# Patient Record
Sex: Female | Born: 1990 | Race: White | Hispanic: No | Marital: Married | Smoking: Never smoker
Health system: Southern US, Community
[De-identification: ages and names within clinical notes are randomized; demographics above are authoritative.]

## PROBLEM LIST (undated history)

## (undated) DIAGNOSIS — F32A Depression, unspecified: Secondary | ICD-10-CM

## (undated) DIAGNOSIS — J45909 Unspecified asthma, uncomplicated: Secondary | ICD-10-CM

## (undated) DIAGNOSIS — K219 Gastro-esophageal reflux disease without esophagitis: Secondary | ICD-10-CM

## (undated) DIAGNOSIS — R51 Headache: Secondary | ICD-10-CM

## (undated) DIAGNOSIS — F419 Anxiety disorder, unspecified: Secondary | ICD-10-CM

## (undated) DIAGNOSIS — T7840XA Allergy, unspecified, initial encounter: Secondary | ICD-10-CM

## (undated) DIAGNOSIS — R519 Headache, unspecified: Secondary | ICD-10-CM

## (undated) DIAGNOSIS — R000 Tachycardia, unspecified: Secondary | ICD-10-CM

## (undated) DIAGNOSIS — R0600 Dyspnea, unspecified: Secondary | ICD-10-CM

## (undated) DIAGNOSIS — D649 Anemia, unspecified: Secondary | ICD-10-CM

## (undated) HISTORY — PX: WISDOM TOOTH EXTRACTION: SHX21

## (undated) HISTORY — DX: Allergy, unspecified, initial encounter: T78.40XA

## (undated) HISTORY — DX: Headache: R51

## (undated) HISTORY — DX: Anxiety disorder, unspecified: F41.9

## (undated) HISTORY — DX: Depression, unspecified: F32.A

## (undated) HISTORY — DX: Anemia, unspecified: D64.9

## (undated) HISTORY — DX: Gastro-esophageal reflux disease without esophagitis: K21.9

## (undated) HISTORY — DX: Headache, unspecified: R51.9

---
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
## 2016-05-15 ENCOUNTER — Other Ambulatory Visit: Payer: Self-pay | Admitting: Family Medicine

## 2016-05-15 DIAGNOSIS — E010 Iodine-deficiency related diffuse (endemic) goiter: Secondary | ICD-10-CM

## 2016-05-19 ENCOUNTER — Ambulatory Visit
Admission: RE | Admit: 2016-05-19 | Discharge: 2016-05-19 | Disposition: A | Discharge status: Home / Self Care | Payer: BLUE CROSS/BLUE SHIELD | Source: Ambulatory Visit | Attending: Family Medicine | Admitting: Family Medicine

## 2016-05-19 DIAGNOSIS — E010 Iodine-deficiency related diffuse (endemic) goiter: Secondary | ICD-10-CM

## 2017-08-17 IMAGING — US US THYROID
1 series · 14 of 25 positions shown · non-contrast
Comparison: None.

CLINICAL DATA: Thyromegaly on physical exam. Family history of
Hashimoto's disease.

EXAM:
THYROID ULTRASOUND
TECHNIQUE: Ultrasound examination of the thyroid gland and adjacent soft
tissues was performed.

[Series 1: us thyroid · 0.05mm/px · 14 of 36 slices shown]
[im 1/36]
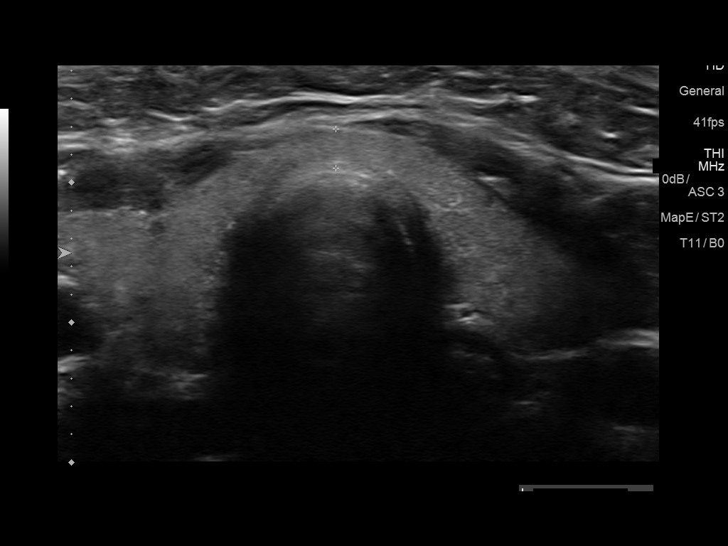
[im 3/36]
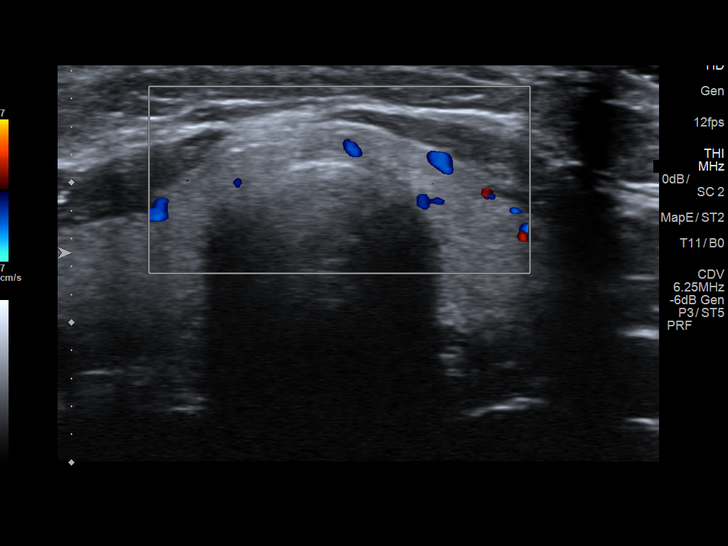
[im 6/36]
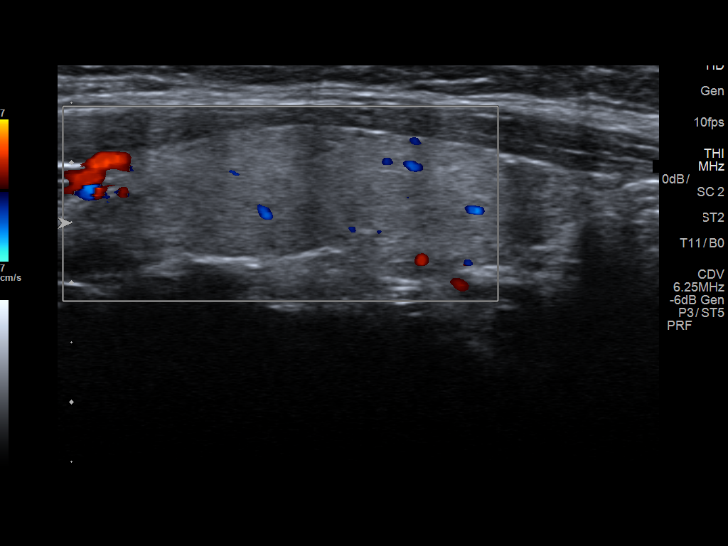
[im 9/36]
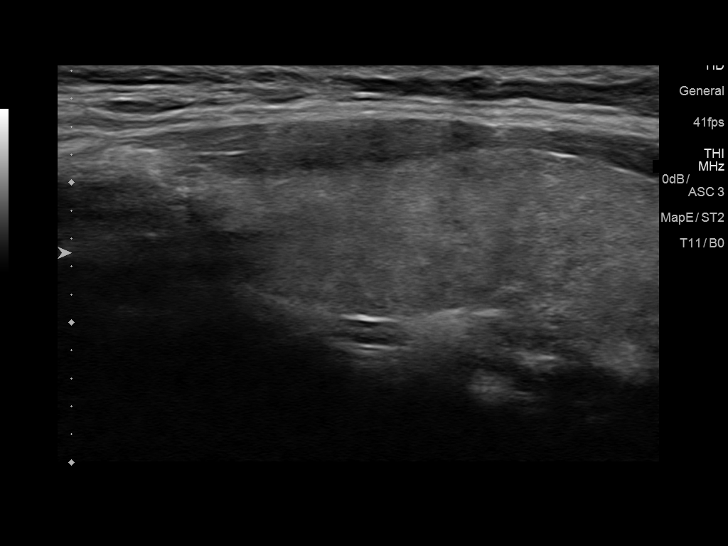
[im 12/36]
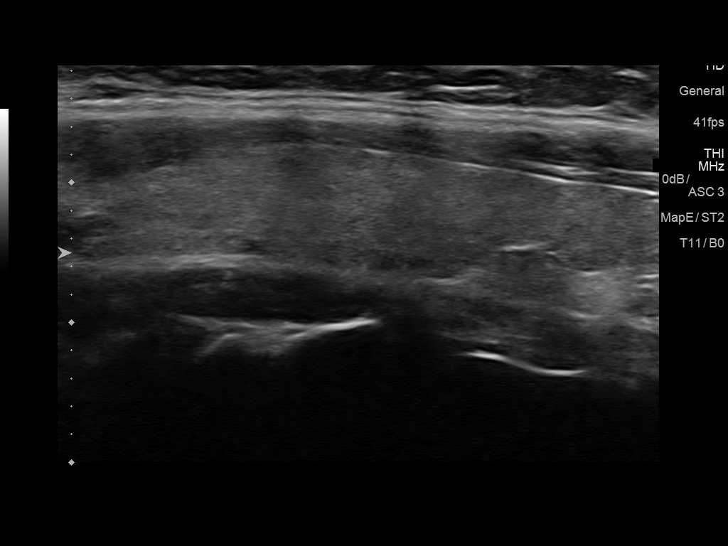
[im 14/36]
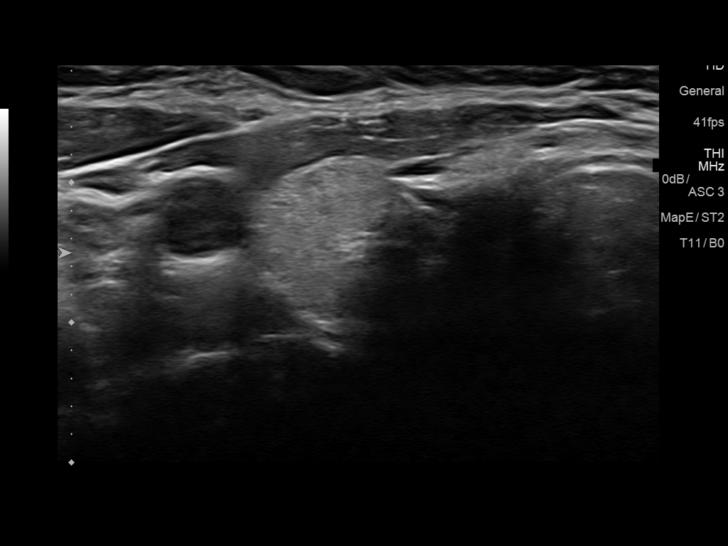
[im 17/36]
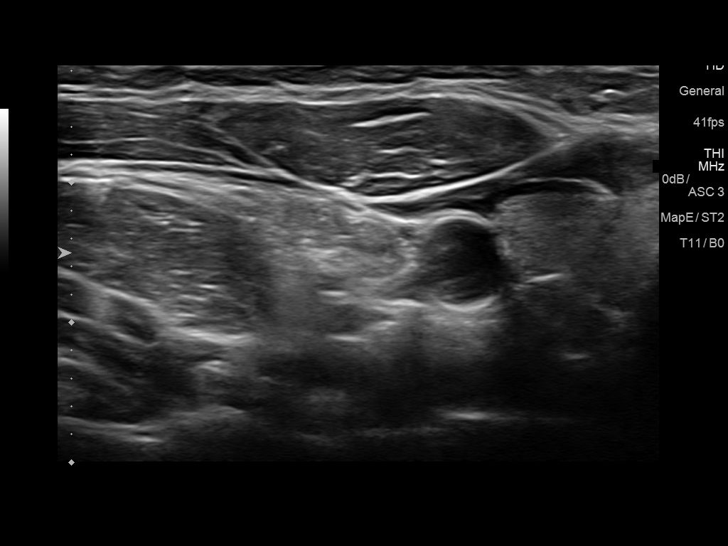
[im 19/36]
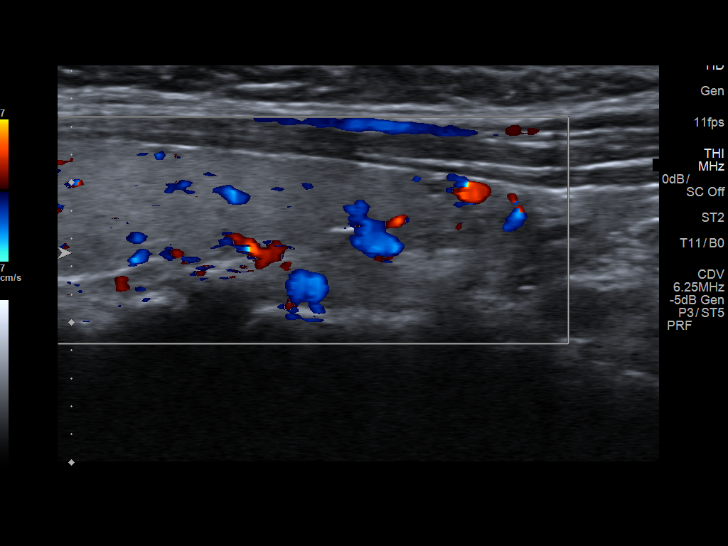
[im 22/36]
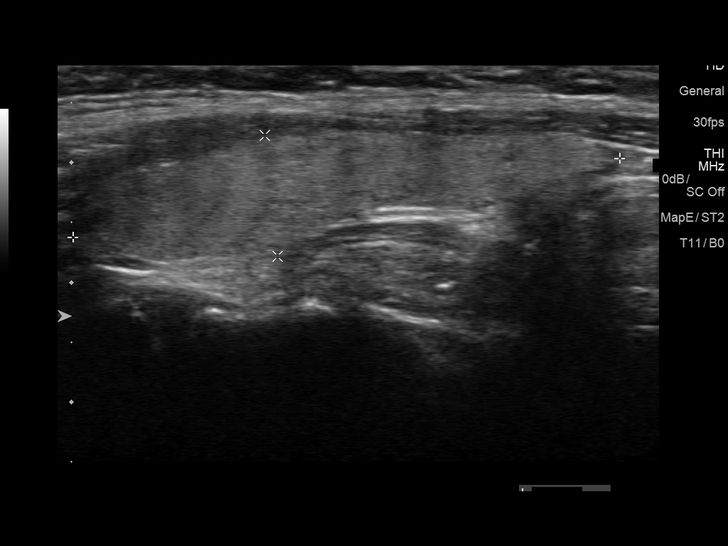
[im 24/36]
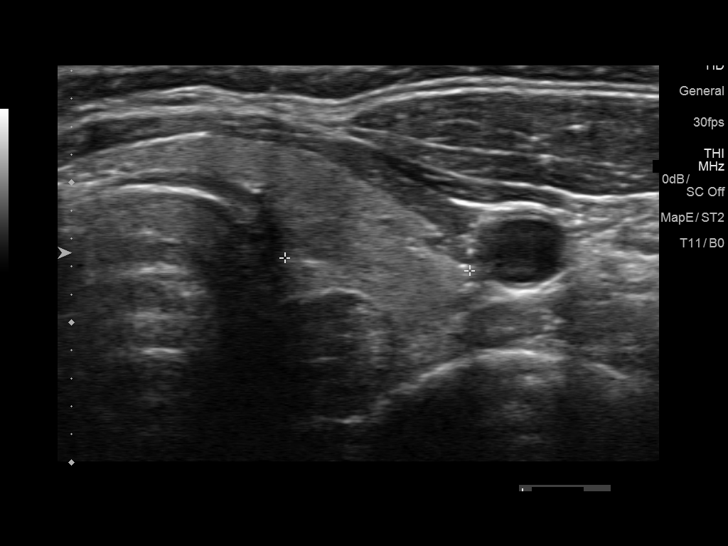
[im 27/36]
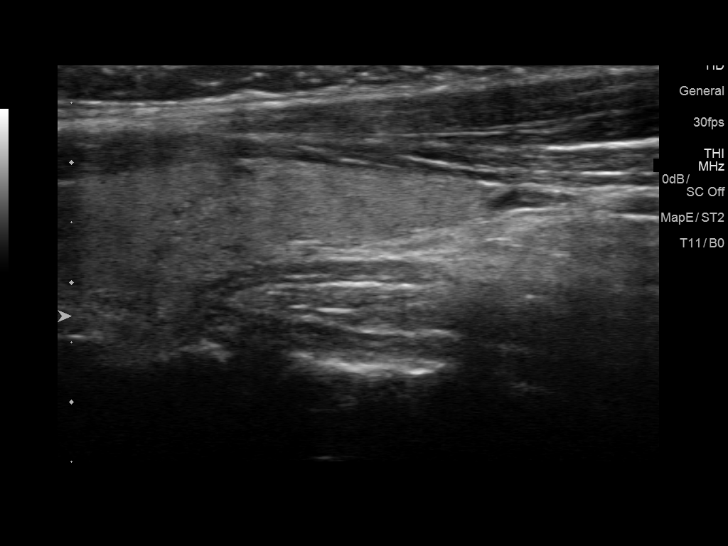
[im 30/36]
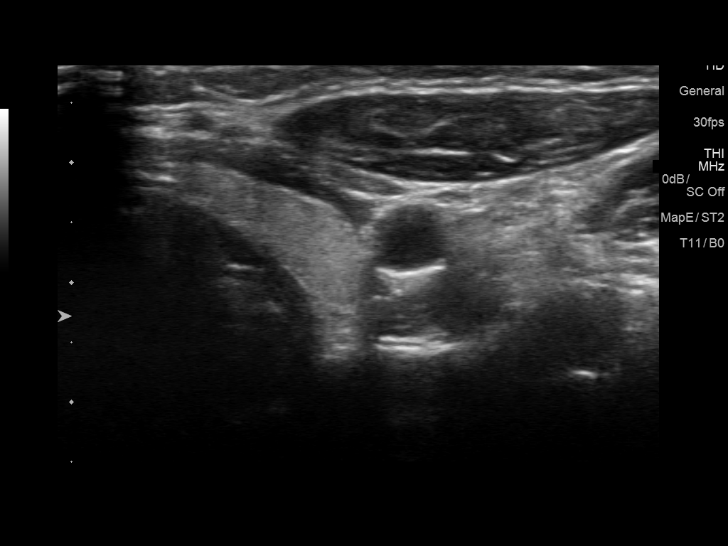
[im 33/36]
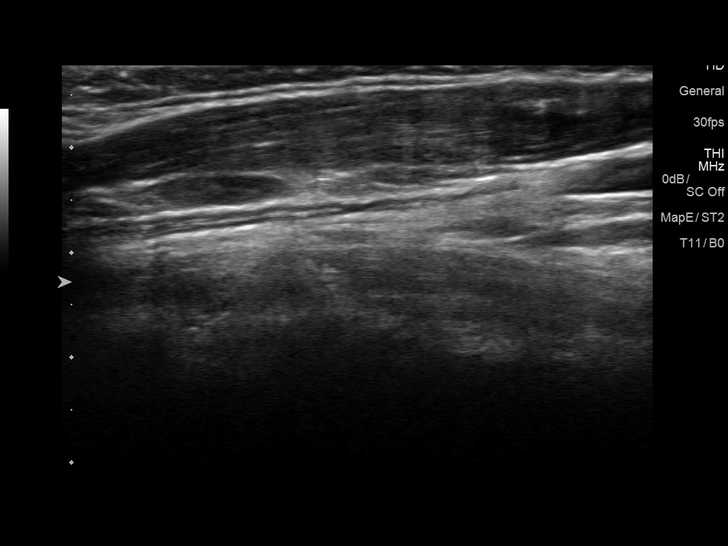
[im 36/36]
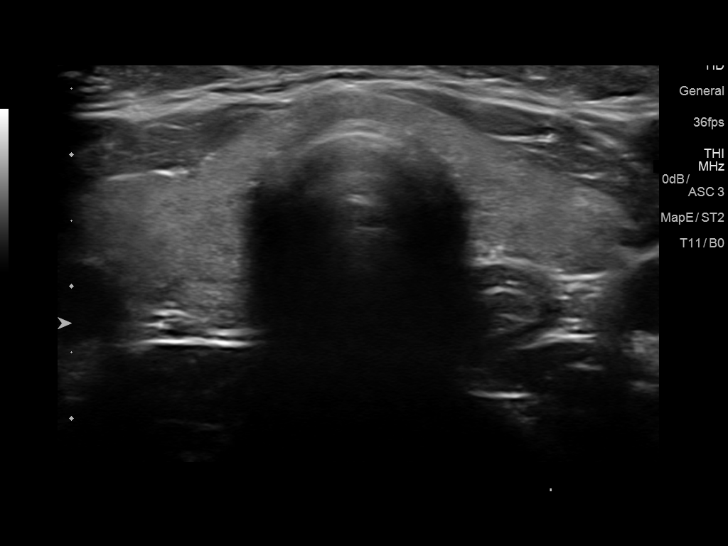

[14 of 25 positions shown; findings below may reference images not displayed]

FINDINGS: Parenchymal Echotexture: Mildly heterogenous

Isthmus: 0.3 cm thickness

Right lobe: 4.4 x 1.2 x 1.5 cm

Left lobe: 4.6 x 1 x 1.3 cm

_________________________________________________________

Estimated total number of nodules >/= 1 cm: 0

Number of spongiform nodules >/=  2 cm not described below (TR1): 0

Number of mixed cystic and solid nodules >/= 1.5 cm not described
below (TR2): 0

No discrete nodules are seen within the thyroid gland.
IMPRESSION: 1. Thyroid is upper limits normal in size. No nodule or focal
lesion.

The above is in keeping with the ACR TI-RADS recommendations - [HOSPITAL] 1085;[DATE].

## 2017-11-17 LAB — OB RESULTS CONSOLE ABO/RH: RH Type: POSITIVE

## 2017-11-17 LAB — OB RESULTS CONSOLE RUBELLA ANTIBODY, IGM: Rubella: IMMUNE

## 2017-11-17 LAB — OB RESULTS CONSOLE HIV ANTIBODY (ROUTINE TESTING): HIV: NONREACTIVE

## 2017-11-17 LAB — OB RESULTS CONSOLE GC/CHLAMYDIA
Chlamydia: NEGATIVE
Gonorrhea: NEGATIVE

## 2017-11-17 LAB — OB RESULTS CONSOLE ANTIBODY SCREEN: Antibody Screen: NEGATIVE

## 2017-11-17 LAB — OB RESULTS CONSOLE RPR: RPR: NONREACTIVE

## 2017-11-17 LAB — OB RESULTS CONSOLE HEPATITIS B SURFACE ANTIGEN: Hepatitis B Surface Ag: NEGATIVE

## 2018-04-30 LAB — OB RESULTS CONSOLE GBS: GBS: POSITIVE

## 2018-05-05 ENCOUNTER — Encounter (HOSPITAL_COMMUNITY): Payer: Self-pay | Admitting: *Deleted

## 2018-05-05 ENCOUNTER — Telehealth (HOSPITAL_COMMUNITY): Payer: Self-pay | Admitting: *Deleted

## 2018-05-05 NOTE — Telephone Encounter (Signed)
Preadmission screen  

## 2018-05-07 ENCOUNTER — Inpatient Hospital Stay (HOSPITAL_COMMUNITY): Admission: RE | Admit: 2018-05-07 | Payer: BLUE CROSS/BLUE SHIELD | Source: Ambulatory Visit

## 2018-05-17 ENCOUNTER — Other Ambulatory Visit: Payer: Self-pay | Admitting: Obstetrics

## 2018-05-20 ENCOUNTER — Encounter (HOSPITAL_COMMUNITY): Payer: Self-pay

## 2018-05-28 ENCOUNTER — Encounter (HOSPITAL_COMMUNITY)
Admission: RE | Admit: 2018-05-28 | Discharge: 2018-05-28 | Disposition: A | Discharge status: Home / Self Care | Payer: Managed Care, Other (non HMO) | Source: Ambulatory Visit

## 2018-05-28 NOTE — Patient Instructions (Signed)
Ranell PatrickKelsey M Mcnellis  05/28/2018   Your procedure is scheduled on:  05/31/2018  Enter through the Main Entrance of [facility] at 1200 PM.  Pick up the phone at the desk and dial [phone]  Call this number if you have problems the morning of [phone]  Remember:   Do not eat food:(After Midnight) Desps de medianoche.  Do not drink clear liquids: (After Midnight) Desps de medianoche.  Take these medicines the morning of surgery with A SIP OF WATER: none   Do not wear jewelry, make-up or nail polish.  Do not wear lotions, powders, or perfumes. Do not wear deodorant.  Do not shave 48 hours prior to surgery.  Do not bring valuables to the hospital.  [facility] is not   responsible for any belongings or valuables brought to the hospital.  Contacts, dentures or bridgework may not be worn into surgery.  Leave suitcase in the car. After surgery it may be brought to your room.  For patients admitted to the hospital, checkout time is 11:00 AM the day of              discharge.    N/A   Please read over the following fact sheets that you were given:   Surgical Site Infection Prevention

## 2018-05-28 NOTE — Pre-Procedure Instructions (Signed)
Pt has not arrived for her preop visit.  Contacted by phone.  Pt states she is sick and is at her doctor's office for a flu check.  She says she will contact her OB with the results and determine if her CS needs to be delayed.  She states she will contact me after talking to Dr Elpidio Eric office.

## 2018-05-31 SURGERY — Surgical Case
Anesthesia: Spinal

## 2018-06-04 ENCOUNTER — Inpatient Hospital Stay (HOSPITAL_COMMUNITY): Payer: Managed Care, Other (non HMO) | Admitting: Certified Registered"

## 2018-06-04 ENCOUNTER — Encounter (HOSPITAL_COMMUNITY): Payer: Self-pay | Admitting: *Deleted

## 2018-06-04 ENCOUNTER — Encounter (HOSPITAL_COMMUNITY)
Admission: AD | Disposition: A | Discharge status: Home / Self Care | Payer: Self-pay | Source: Home / Self Care | Attending: Obstetrics and Gynecology

## 2018-06-04 ENCOUNTER — Encounter (HOSPITAL_COMMUNITY)
Admission: RE | Admit: 2018-06-04 | Discharge: 2018-06-04 | Disposition: A | Discharge status: Home / Self Care | Payer: Managed Care, Other (non HMO) | Source: Ambulatory Visit | Attending: Obstetrics | Admitting: Obstetrics

## 2018-06-04 ENCOUNTER — Inpatient Hospital Stay (HOSPITAL_COMMUNITY)
Admission: AD | Admit: 2018-06-04 | Discharge: 2018-06-07 | DRG: 787 | Disposition: A | Discharge status: Home / Self Care | Payer: Managed Care, Other (non HMO) | Attending: Obstetrics and Gynecology | Admitting: Obstetrics and Gynecology

## 2018-06-04 DIAGNOSIS — Z3A40 40 weeks gestation of pregnancy: Secondary | ICD-10-CM

## 2018-06-04 DIAGNOSIS — O321XX0 Maternal care for breech presentation, not applicable or unspecified: Secondary | ICD-10-CM | POA: Diagnosis present

## 2018-06-04 DIAGNOSIS — D62 Acute posthemorrhagic anemia: Secondary | ICD-10-CM | POA: Diagnosis not present

## 2018-06-04 DIAGNOSIS — O99824 Streptococcus B carrier state complicating childbirth: Secondary | ICD-10-CM | POA: Diagnosis present

## 2018-06-04 DIAGNOSIS — O9081 Anemia of the puerperium: Secondary | ICD-10-CM | POA: Diagnosis not present

## 2018-06-04 LAB — TYPE AND SCREEN
ABO/RH(D): O POS
Antibody Screen: NEGATIVE

## 2018-06-04 LAB — CBC
HCT: 40.3 % (ref 36.0–46.0)
Hemoglobin: 13 g/dL (ref 12.0–15.0)
MCH: 26.7 pg (ref 26.0–34.0)
MCHC: 32.3 g/dL (ref 30.0–36.0)
MCV: 82.8 fL (ref 80.0–100.0)
Platelets: 201 10*3/uL (ref 150–400)
RBC: 4.87 MIL/uL (ref 3.87–5.11)
RDW: 17.3 % — ABNORMAL HIGH (ref 11.5–15.5)
WBC: 10.5 10*3/uL (ref 4.0–10.5)
nRBC: 0 % (ref 0.0–0.2)

## 2018-06-04 LAB — ABO/RH: ABO/RH(D): O POS

## 2018-06-04 LAB — POCT FERN TEST

## 2018-06-04 SURGERY — Surgical Case
Anesthesia: Spinal | Site: Abdomen | Wound class: Clean Contaminated

## 2018-06-04 MED ORDER — LACTATED RINGERS IV SOLN
INTRAVENOUS | Status: DC
  Administered 2018-06-04 – 2018-06-05 (×3): via INTRAVENOUS

## 2018-06-04 MED ORDER — SOD CITRATE-CITRIC ACID 500-334 MG/5ML PO SOLN
ORAL | Status: AC
  Filled 2018-06-04: qty 15

## 2018-06-04 MED ORDER — FENTANYL CITRATE (PF) 100 MCG/2ML IJ SOLN
INTRAMUSCULAR | Status: AC
  Filled 2018-06-04: qty 2

## 2018-06-04 MED ORDER — CEFAZOLIN SODIUM-DEXTROSE 2-4 GM/100ML-% IV SOLN
2.0000 g | INTRAVENOUS | Status: AC
  Administered 2018-06-05: 2 g via INTRAVENOUS
  Filled 2018-06-04: qty 100

## 2018-06-04 MED ORDER — MORPHINE SULFATE (PF) 0.5 MG/ML IJ SOLN
INTRAMUSCULAR | Status: AC
  Filled 2018-06-04: qty 10

## 2018-06-04 SURGICAL SUPPLY — 39 items
BENZOIN TINCTURE PRP APPL 2/3 (GAUZE/BANDAGES/DRESSINGS) ×2 IMPLANT
CHLORAPREP W/TINT 26ML (MISCELLANEOUS) ×2 IMPLANT
CLAMP CORD UMBIL (MISCELLANEOUS) IMPLANT
CLOTH BEACON ORANGE TIMEOUT ST (SAFETY) ×2 IMPLANT
DRSG OPSITE POSTOP 4X10 (GAUZE/BANDAGES/DRESSINGS) ×2 IMPLANT
ELECT REM PT RETURN 9FT ADLT (ELECTROSURGICAL) ×2
ELECTRODE REM PT RTRN 9FT ADLT (ELECTROSURGICAL) ×1 IMPLANT
EXTRACTOR VACUUM M CUP 4 TUBE (SUCTIONS) IMPLANT
GLOVE BIO SURGEON STRL SZ 6.5 (GLOVE) ×14 IMPLANT
GLOVE BIOGEL PI IND STRL 7.0 (GLOVE) ×5 IMPLANT
GLOVE BIOGEL PI INDICATOR 7.0 (GLOVE) ×5
GOWN STRL REUS W/TWL LRG LVL3 (GOWN DISPOSABLE) ×6 IMPLANT
HEMOSTAT ARISTA ABSORB 3G PWDR (HEMOSTASIS) ×2 IMPLANT
KIT ABG SYR 3ML LUER SLIP (SYRINGE) IMPLANT
NEEDLE HYPO 22GX1.5 SAFETY (NEEDLE) IMPLANT
NEEDLE HYPO 25X5/8 SAFETYGLIDE (NEEDLE) IMPLANT
NS IRRIG 1000ML POUR BTL (IV SOLUTION) ×2 IMPLANT
PACK C SECTION WH (CUSTOM PROCEDURE TRAY) ×2 IMPLANT
PAD OB MATERNITY 4.3X12.25 (PERSONAL CARE ITEMS) ×2 IMPLANT
PENCIL SMOKE EVAC W/HOLSTER (ELECTROSURGICAL) ×2 IMPLANT
SPONGE LAP 18X18 RF (DISPOSABLE) ×6 IMPLANT
STRIP CLOSURE SKIN 1/2X4 (GAUZE/BANDAGES/DRESSINGS) ×2 IMPLANT
SUT MNCRL 0 VIOLET CTX 36 (SUTURE) ×2 IMPLANT
SUT MON AB 4-0 PS1 27 (SUTURE) ×2 IMPLANT
SUT MONOCRYL 0 CTX 36 (SUTURE) ×2
SUT PLAIN 0 NONE (SUTURE) IMPLANT
SUT PLAIN 2 0 XLH (SUTURE) IMPLANT
SUT VIC AB 0 CT1 36 (SUTURE) ×10 IMPLANT
SUT VIC AB 0 CTX 36 (SUTURE) ×2
SUT VIC AB 0 CTX36XBRD ANBCTRL (SUTURE) ×2 IMPLANT
SUT VIC AB 2-0 CT1 27 (SUTURE) ×1
SUT VIC AB 2-0 CT1 TAPERPNT 27 (SUTURE) ×1 IMPLANT
SUT VIC AB 3-0 CT1 27 (SUTURE) ×1
SUT VIC AB 3-0 CT1 TAPERPNT 27 (SUTURE) ×1 IMPLANT
SUT VIC AB 4-0 KS 27 (SUTURE) ×2 IMPLANT
SYR CONTROL 10ML LL (SYRINGE) IMPLANT
TOWEL OR 17X24 6PK STRL BLUE (TOWEL DISPOSABLE) ×2 IMPLANT
TRAY FOLEY W/BAG SLVR 14FR LF (SET/KITS/TRAYS/PACK) IMPLANT
WATER STERILE IRR 1000ML POUR (IV SOLUTION) ×2 IMPLANT

## 2018-06-04 NOTE — Anesthesia Preprocedure Evaluation (Signed)
Anesthesia Evaluation  Patient identified by MRN, date of birth, ID band Patient awake    Reviewed: Allergy & Precautions, NPO status , Patient's Chart, lab work & pertinent test results  Airway Mallampati: II  TM Distance: >3 FB Neck ROM: Full    Dental  (+) Dental Advisory Given   Pulmonary neg pulmonary ROS,    Pulmonary exam normal breath sounds clear to auscultation       Cardiovascular negative cardio ROS Normal cardiovascular exam Rhythm:Regular Rate:Normal     Neuro/Psych  Headaches, negative psych ROS   GI/Hepatic negative GI ROS, Neg liver ROS,   Endo/Other  negative endocrine ROS  Renal/GU negative Renal ROS     Musculoskeletal negative musculoskeletal ROS (+)   Abdominal   Peds  Hematology negative hematology ROS (+) anemia ,   Anesthesia Other Findings   Reproductive/Obstetrics (+) Pregnancy                             Anesthesia Physical Anesthesia Plan  ASA: II  Anesthesia Plan: Spinal   Post-op Pain Management:    Induction:   PONV Risk Score and Plan: Ondansetron, Treatment may vary due to age or medical condition and Scopolamine patch - Pre-op  Airway Management Planned:   Additional Equipment:   Intra-op Plan:   Post-operative Plan:   Informed Consent: I have reviewed the patients History and Physical, chart, labs and discussed the procedure including the risks, benefits and alternatives for the proposed anesthesia with the patient or authorized representative who has indicated his/her understanding and acceptance.     Dental advisory given  Plan Discussed with: CRNA  Anesthesia Plan Comments:         Anesthesia Quick Evaluation

## 2018-06-04 NOTE — MAU Note (Signed)
Ctxs for about 2hrs. Feeling a lot of pain lower back. Baby is breech -for prim c/s this Monday morning. Dx flu last Friday and completed Tamiflu. No fever since last Sunday. Some pink/brown d/c today. My underwear has been wet at times but nothing consistent.

## 2018-06-04 NOTE — Patient Instructions (Signed)
TENAJ HARNETT  06/04/2018   Your procedure is scheduled on:  06/07/2018  Enter through the Main Entrance of [facility] at 0745 AM.  Pick up the phone at the desk and dial [phone]  Call this number if you have problems the morning of surgery:[phone]  Remember:   Do not eat food:(After Midnight) Desps de medianoche.  Do not drink clear liquids: (After Midnight) Desps de medianoche.  Take these medicines the morning of surgery with A SIP OF WATER: bring your inhaler with you   Do not wear jewelry, make-up or nail polish.  Do not wear lotions, powders, or perfumes. Do not wear deodorant.  Do not shave 48 hours prior to surgery.  Do not bring valuables to the hospital.  [facility] is not   responsible for any belongings or valuables brought to the hospital.  Contacts, dentures or bridgework may not be worn into surgery.  Leave suitcase in the car. After surgery it may be brought to your room.  For patients admitted to the hospital, checkout time is 11:00 AM the day of              discharge.    N/A   Please read over the following fact sheets that you were given:   Surgical Site Infection Prevention

## 2018-06-04 NOTE — H&P (Signed)
Vickie Maldonado is a 28 y.o. G1P0 at [redacted]w[redacted]d gestation presents for complaint of Contractions, denies LOF, vaginal bleeding.  Breech presentation.  Antepartum course: complicated with Flu last week, s/p Tamiflu and no c/o  [facility] since 11 wks.  See complete pre-natal records  History OB History    Gravida  1   Para      Term      Preterm      AB      Living        SAB      TAB      Ectopic      Multiple      Live Births             Past Medical History:  Diagnosis Date  . Anemia   . Headache    Past Surgical History:  Procedure Laterality Date  . WISDOM TOOTH EXTRACTION     Family History: family history includes Breast cancer in her maternal aunt and maternal grandmother; Hypertension in her father, mother, and paternal grandfather. Social History:  reports that she has never smoked. She has never used smokeless tobacco. She reports previous alcohol use. She reports that she does not use drugs.  ROS: See above otherwise negative  Prenatal labs:  ABO, Rh: --/--/O POS Performed at [facility], [address] [place], [place] [address]  [phone]) Antibody: NEG (02/14 0915) Rubella: Immune (07/30 0000) RPR: Nonreactive (07/30 0000)  HBsAg: Negative (07/30 0000)  HIV:Non-reactive (07/30 0000)   GBS: Positive (01/10 0000)  1 hr Glucola: Normal Genetic screening: Normal Anatomy US: Normal  Physical Exam:   Dilation: 2.5 Effacement (%): 80 Station: -2 Exam by:: Lafonda Mosses Ansah-Mensah, rnc  Blood pressure 134/78, pulse 89, temperature 98.3 F (36.8 C), resp. rate 18, height 5\' 7"  (1.702 m), weight 93 kg. A&O x 3 HEENT: Normal Lungs: CTAB CV: RRR Abdominal: Soft, Non-tender, Gravid and Estimated fetal weight: 7.5 lbs lbs  Lower Extremities: Non-edematous, Non-tender  Pelvic Exam:   deferred  Labs:  CBC:  Lab Results  Component Value Date   WBC 10.5 06/04/2018   RBC 4.87 06/04/2018   HGB 13.0 06/04/2018   HCT 40.3  06/04/2018   MCV 82.8 06/04/2018   MCH 26.7 06/04/2018   MCHC 32.3 06/04/2018   RDW 17.3 (H) 06/04/2018   PLT 201 06/04/2018   CMP: No results found for: NA, K, CL, CO2, GLUCOSE, BUN, CREATININE, CALCIUM, PROT, AST, ALT, ALBUMIN, ALKPHOS, BILITOT, GFRNONAA, GFRAA, ANIONGAP Urine: No results found for: COLORURINE, APPEARANCEUR, LABSPEC, PHURINE, GLUCOSEU, HGBUR, BILIRUBINUR, KETONESUR, PROTEINUR, NITRITE, LEUKOCYTESUR   Prenatal Transfer Tool  Maternal Diabetes: No Genetic Screening: Normal Maternal Ultrasounds/Referrals: Normal Fetal Ultrasounds or other Referrals:  None Maternal Substance Abuse:  No Significant Maternal Medications:  None Significant Maternal Lab Results: None  U/s: breech, head  Assessment/Plan:  28 y.o. G1P0 at [redacted]w[redacted]d gestation   1. Active labor - admit for prenatal care, plan for c/s d/t malpresentation; reviewed surgery and risks, including but not limited to bleeding (possible need for transfution), infection, injury to bowel/bladder/nerves/blood vessels, risk of further surgery, risk of anesthesia, risk of blood clot to legs/lungs 2. Fetal status reassuring 3. GBS negative   Vickie Maldonado 06/04/2018, 11:44 PM

## 2018-06-05 ENCOUNTER — Encounter (HOSPITAL_COMMUNITY): Payer: Self-pay | Admitting: *Deleted

## 2018-06-05 LAB — CBC
HCT: 31.3 % — ABNORMAL LOW (ref 36.0–46.0)
Hemoglobin: 10 g/dL — ABNORMAL LOW (ref 12.0–15.0)
MCH: 26.1 pg (ref 26.0–34.0)
MCHC: 31.6 g/dL (ref 30.0–36.0)
MCV: 82.6 fL (ref 80.0–100.0)
Platelets: 193 10*3/uL (ref 150–400)
RBC: 3.79 MIL/uL — ABNORMAL LOW (ref 3.87–5.11)
RDW: 17.2 % — ABNORMAL HIGH (ref 11.5–15.5)
WBC: 20.9 10*3/uL — ABNORMAL HIGH (ref 4.0–10.5)
nRBC: 0 % (ref 0.0–0.2)

## 2018-06-05 LAB — RPR: RPR Ser Ql: NONREACTIVE

## 2018-06-05 MED ORDER — FENTANYL CITRATE (PF) 100 MCG/2ML IJ SOLN
INTRAMUSCULAR | Status: DC | PRN
  Administered 2018-06-04: 15 ug via INTRATHECAL
  Administered 2018-06-05: 50 ug via INTRAVENOUS
  Administered 2018-06-05: 35 ug via INTRAVENOUS

## 2018-06-05 MED ORDER — DIPHENHYDRAMINE HCL 25 MG PO CAPS
25.0000 mg | ORAL_CAPSULE | Freq: Four times a day (QID) | ORAL | Status: DC | PRN
  Filled 2018-06-05: qty 1

## 2018-06-05 MED ORDER — MENTHOL 3 MG MT LOZG
1.0000 | LOZENGE | OROMUCOSAL | Status: DC | PRN
  Filled 2018-06-05: qty 9

## 2018-06-05 MED ORDER — SCOPOLAMINE 1 MG/3DAYS TD PT72: 1.0000 | MEDICATED_PATCH | Freq: Once | TRANSDERMAL | Status: DC

## 2018-06-05 MED ORDER — SIMETHICONE 80 MG PO CHEW
80.0000 mg | CHEWABLE_TABLET | Freq: Three times a day (TID) | ORAL | Status: DC
  Administered 2018-06-05 – 2018-06-07 (×7): 80 mg via ORAL
  Filled 2018-06-05 (×13): qty 1

## 2018-06-05 MED ORDER — LACTATED RINGERS IV SOLN
INTRAVENOUS | Status: DC
  Administered 2018-06-05: 04:00:00 via INTRAVENOUS

## 2018-06-05 MED ORDER — MEPERIDINE HCL 25 MG/ML IJ SOLN
INTRAMUSCULAR | Status: DC | PRN
  Administered 2018-06-05: 12.5 mg via INTRAVENOUS

## 2018-06-05 MED ORDER — DEXAMETHASONE SODIUM PHOSPHATE 4 MG/ML IJ SOLN
INTRAMUSCULAR | Status: AC
  Filled 2018-06-05: qty 1

## 2018-06-05 MED ORDER — SIMETHICONE 80 MG PO CHEW
80.0000 mg | CHEWABLE_TABLET | ORAL | Status: DC
  Administered 2018-06-06 (×2): 80 mg via ORAL
  Filled 2018-06-05 (×4): qty 1

## 2018-06-05 MED ORDER — DIPHENHYDRAMINE HCL 25 MG PO CAPS
25.0000 mg | ORAL_CAPSULE | ORAL | Status: DC | PRN
  Filled 2018-06-05: qty 1

## 2018-06-05 MED ORDER — SODIUM CHLORIDE 0.9 % IR SOLN
Status: DC | PRN
  Administered 2018-06-05: 600 mL

## 2018-06-05 MED ORDER — DIBUCAINE 1 % RE OINT
1.0000 "application " | TOPICAL_OINTMENT | RECTAL | Status: DC | PRN
  Administered 2018-06-06: 1 via RECTAL
  Filled 2018-06-05 (×2): qty 28

## 2018-06-05 MED ORDER — TETANUS-DIPHTH-ACELL PERTUSSIS 5-2.5-18.5 LF-MCG/0.5 IM SUSP
0.5000 mL | Freq: Once | INTRAMUSCULAR | Status: DC
  Filled 2018-06-05: qty 0.5

## 2018-06-05 MED ORDER — OXYTOCIN 40 UNITS IN NORMAL SALINE INFUSION - SIMPLE MED: 2.5000 [IU]/h | INTRAVENOUS | Status: AC

## 2018-06-05 MED ORDER — ONDANSETRON HCL 4 MG/2ML IJ SOLN: 4.0000 mg | Freq: Three times a day (TID) | INTRAMUSCULAR | Status: DC | PRN

## 2018-06-05 MED ORDER — NALBUPHINE HCL 10 MG/ML IJ SOLN: 5.0000 mg | INTRAMUSCULAR | Status: DC | PRN

## 2018-06-05 MED ORDER — MEPERIDINE HCL 25 MG/ML IJ SOLN: 6.2500 mg | INTRAMUSCULAR | Status: DC | PRN

## 2018-06-05 MED ORDER — SODIUM CHLORIDE 0.9% FLUSH: 3.0000 mL | INTRAVENOUS | Status: DC | PRN

## 2018-06-05 MED ORDER — HYDROMORPHONE HCL 1 MG/ML IJ SOLN: 0.2500 mg | INTRAMUSCULAR | Status: DC | PRN

## 2018-06-05 MED ORDER — NALBUPHINE HCL 10 MG/ML IJ SOLN: 5.0000 mg | Freq: Once | INTRAMUSCULAR | Status: DC | PRN

## 2018-06-05 MED ORDER — NALOXONE HCL 4 MG/10ML IJ SOLN
1.0000 ug/kg/h | INTRAVENOUS | Status: DC | PRN
  Filled 2018-06-05: qty 5

## 2018-06-05 MED ORDER — PHENYLEPHRINE 8 MG IN D5W 100 ML (0.08MG/ML) PREMIX OPTIME
INJECTION | INTRAVENOUS | Status: DC | PRN
  Administered 2018-06-04: 60 ug/min via INTRAVENOUS

## 2018-06-05 MED ORDER — OXYTOCIN 10 UNIT/ML IJ SOLN
INTRAVENOUS | Status: DC | PRN
  Administered 2018-06-05: 40 [IU] via INTRAVENOUS

## 2018-06-05 MED ORDER — ACETAMINOPHEN 500 MG PO TABS
1000.0000 mg | ORAL_TABLET | Freq: Four times a day (QID) | ORAL | Status: DC | PRN
  Administered 2018-06-05 – 2018-06-07 (×9): 1000 mg via ORAL
  Filled 2018-06-05 (×10): qty 2

## 2018-06-05 MED ORDER — OXYTOCIN 10 UNIT/ML IJ SOLN
INTRAMUSCULAR | Status: AC
  Filled 2018-06-05: qty 4

## 2018-06-05 MED ORDER — PRENATAL MULTIVITAMIN CH
1.0000 | ORAL_TABLET | Freq: Every day | ORAL | Status: DC
  Administered 2018-06-05 – 2018-06-07 (×3): 1 via ORAL
  Filled 2018-06-05 (×5): qty 1

## 2018-06-05 MED ORDER — NALOXONE HCL 0.4 MG/ML IJ SOLN: 0.4000 mg | INTRAMUSCULAR | Status: DC | PRN

## 2018-06-05 MED ORDER — ONDANSETRON HCL 4 MG/2ML IJ SOLN
INTRAMUSCULAR | Status: DC | PRN
  Administered 2018-06-05: 4 mg via INTRAVENOUS

## 2018-06-05 MED ORDER — SENNOSIDES-DOCUSATE SODIUM 8.6-50 MG PO TABS
2.0000 | ORAL_TABLET | ORAL | Status: DC
  Administered 2018-06-06 (×2): 2 via ORAL
  Filled 2018-06-05 (×4): qty 2

## 2018-06-05 MED ORDER — KETOROLAC TROMETHAMINE 30 MG/ML IJ SOLN: 30.0000 mg | Freq: Once | INTRAMUSCULAR | Status: DC | PRN

## 2018-06-05 MED ORDER — ONDANSETRON HCL 4 MG/2ML IJ SOLN
INTRAMUSCULAR | Status: AC
  Filled 2018-06-05: qty 2

## 2018-06-05 MED ORDER — ALBUTEROL SULFATE (2.5 MG/3ML) 0.083% IN NEBU: 3.0000 mL | INHALATION_SOLUTION | Freq: Four times a day (QID) | RESPIRATORY_TRACT | Status: DC | PRN

## 2018-06-05 MED ORDER — DIPHENHYDRAMINE HCL 50 MG/ML IJ SOLN: 12.5000 mg | INTRAMUSCULAR | Status: DC | PRN

## 2018-06-05 MED ORDER — PROMETHAZINE HCL 25 MG/ML IJ SOLN: 6.2500 mg | INTRAMUSCULAR | Status: DC | PRN

## 2018-06-05 MED ORDER — SCOPOLAMINE 1 MG/3DAYS TD PT72
MEDICATED_PATCH | TRANSDERMAL | Status: DC | PRN
  Administered 2018-06-05: 1 via TRANSDERMAL

## 2018-06-05 MED ORDER — MORPHINE SULFATE (PF) 0.5 MG/ML IJ SOLN
INTRAMUSCULAR | Status: DC | PRN
  Administered 2018-06-04: .15 mg via INTRATHECAL

## 2018-06-05 MED ORDER — SCOPOLAMINE 1 MG/3DAYS TD PT72
MEDICATED_PATCH | TRANSDERMAL | Status: AC
  Filled 2018-06-05: qty 1

## 2018-06-05 MED ORDER — WITCH HAZEL-GLYCERIN EX PADS
1.0000 "application " | MEDICATED_PAD | CUTANEOUS | Status: DC | PRN
  Administered 2018-06-06: 1 via TOPICAL

## 2018-06-05 MED ORDER — COCONUT OIL OIL
1.0000 "application " | TOPICAL_OIL | Status: DC | PRN
  Filled 2018-06-05: qty 120

## 2018-06-05 MED ORDER — IBUPROFEN 800 MG PO TABS
800.0000 mg | ORAL_TABLET | Freq: Three times a day (TID) | ORAL | Status: DC
  Administered 2018-06-05 – 2018-06-07 (×8): 800 mg via ORAL
  Filled 2018-06-05 (×9): qty 1

## 2018-06-05 MED ORDER — OXYCODONE-ACETAMINOPHEN 5-325 MG PO TABS: 1.0000 | ORAL_TABLET | ORAL | Status: DC | PRN

## 2018-06-05 MED ORDER — LACTATED RINGERS IV SOLN
INTRAVENOUS | Status: DC | PRN
  Administered 2018-06-05: via INTRAVENOUS

## 2018-06-05 MED ORDER — MEPERIDINE HCL 25 MG/ML IJ SOLN
INTRAMUSCULAR | Status: AC
  Filled 2018-06-05: qty 1

## 2018-06-05 MED ORDER — DEXAMETHASONE SODIUM PHOSPHATE 4 MG/ML IJ SOLN
INTRAMUSCULAR | Status: DC | PRN
  Administered 2018-06-05: 4 mg via INTRAVENOUS

## 2018-06-05 MED ORDER — SIMETHICONE 80 MG PO CHEW
80.0000 mg | CHEWABLE_TABLET | ORAL | Status: DC | PRN
  Filled 2018-06-05: qty 1

## 2018-06-05 MED ORDER — BUPIVACAINE IN DEXTROSE 0.75-8.25 % IT SOLN
INTRATHECAL | Status: DC | PRN
  Administered 2018-06-04: 1.6 mL via INTRATHECAL

## 2018-06-05 NOTE — Consult Note (Signed)
Neonatology Note:   Attendance at C-section:    I was asked by Dr. Amado Nash to attend this primary C/S at term due to breech presentation. The mother is a G1P0 O pos, GBS positive with an otherwise uncomplicated delivery. ROM at delivery, fluid clear. Infant vigorous with good spontaneous cry and tone. Delayed cord clamping was done. Needed no suctioning. Ap 9/9. Lungs clear to ausc in DR. Infant is able to remain with hier mother for skin to skin time under nursing supervision. Transferred to the care of Pediatrician.   Doretha Sou, MD

## 2018-06-05 NOTE — Op Note (Signed)
Cesarean Section Procedure Note   Vickie Maldonado  06/04/2018 - 06/05/2018  Indications: Breech Presentation and active labor , term  Pre-operative Diagnosis: breech.  Procedure: 1ltcs Post-operative Diagnosis: Same   Surgeon: Surgeon(s) and Role:       * , Candace Gallus, MD   Assistants: Carlean Jews, CNM  Anesthesia: spinal   Procedure Details:  The patient was seen in the Holding Room. The risks, benefits, complications, treatment options, and expected outcomes were discussed with the patient. The patient concurred with the proposed plan, giving informed consent. identified as Vickie Maldonado and the procedure verified as C-Section Delivery. A Time Out was held and the above information confirmed. U/s confirmed breech presentation. After induction of anesthesia, the patient was draped and prepped in the usual sterile manner, foley was draining urine well.  A pfannenstiel incision was made and carried down through the subcutaneous tissue to the fascia. Fascial incision was made and extended transversely. The fascia was separated from the underlying rectus tissue superiorly and inferiorly. The peritoneum was identified and entered. Peritoneal incision was extended longitudinally. The utero-vesical peritoneal reflection was incised transversely and the bladder flap was bluntly freed from the lower uterine segment and the bladder blade placed. The lower uterine segment was noted to be thin.  A low transverse uterine incision was made. Delivered from breech (frank) presentation was a viable female infant with vigorous cry. Apgar scores of 9 at one minute and 9 at five minutes. Delayed cord clamping done at 1 minute and baby handed to NICU team in attendance. Cord ph was not sent. Cord blood was obtained for evaluation. The placenta was removed spontaneously  Intact and appeared normal. The uterus was then exteriorized for better visualization of the incision d/t bowel and bleeding.  The  uterus was cleared of clot/debris/membranes.  The uterine outline, tubes and ovaries appeared normal}. The uterine incision was closed with running locked sutures of .  . A second imbricating layer was started but there was bleeding of the LUS below the incision d/t the thin segment.  The imbricating layer needed to be cut to allow better visualization to repair the sheared LUS.  The bladder flap was clearly disected downward and the LUS just inferior to the uterine incision was then sutured with 0-vicryl for hemostasis in a running locked fashion.  This brought the sheared edges together and the uterine incision was then closed with a running locked 0 vicryl and then and imbricating layer of 0 vicryl.  Good hemostasis was then achieved.   Hemostasis was observed.    The fascia was then reapproximated with running sutures of 0Vicryl. The subcuticular closure was performed using 3-0 vicryl. The skin was closed with 4-0Vicryl.   Instrument, sponge, and needle counts were correct prior the abdominal closure and were correct at the conclusion of the case.    Findings: viable female infant, apgars 9/9, normal uterus, tubes, ovaries   Estimated Blood Loss: 1124 mL   Total IV Fluids:   Urine Output: 200CC OF clear urine  Specimens: @ORSPECIMEN @   Complications: no complications  Disposition: PACU - hemodynamically stable.   Maternal Condition: stable   Baby condition / location:  Couplet care / Skin to Skin  Attending Attestation: I performed the procedure.   Signed: Rhoderick Moody, MD

## 2018-06-05 NOTE — Lactation Note (Signed)
This note was copied from a baby's chart. Lactation Consultation Note  Patient Name: Vickie Maldonado Date: 06/05/2018 Reason for consult: Initial assessment;Primapara;1st time breastfeeding;Other (Comment)(per  mom baby last fed at 1400 for 5 mins / LC encouaraged to call with feeding cues and to try to latch again by 1600 since the last 2 feedings have been 5 mins )  Baby is 15 hours old / mom P1 ,  Baby has only fed 13 mins, and 2 - 5 mins feedings and attempts. 3 wets /  3 stools / 1 spit.  Mom asked LC to come back due to company in the room.  Mother informed of post-discharge support and given phone number to the lactation department, including services for phone call assistance; out-patient appointments; and breastfeeding support group. List of other breastfeeding resources in the community given in the handout. Encouraged mother to call for problems or concerns related to breastfeeding.  Maternal Data    Feeding Feeding Type: Breast Fed  LATCH Score                   Interventions Interventions: Breast feeding basics reviewed  Lactation Tools Discussed/Used     Consult Status Consult Status: Follow-up Date: 06/05/18 Follow-up type: In-patient    Vickie Maldonado 06/05/2018, 3:29 PM

## 2018-06-05 NOTE — Anesthesia Procedure Notes (Addendum)
Spinal  Patient location during procedure: OR Start time: 06/05/2018 11:53 PM End time: 06/05/2018 11:56 PM Staffing Anesthesiologist: Lewie Loron, MD Preanesthetic Checklist Completed: patient identified, site marked, surgical consent, pre-op evaluation, timeout performed, IV checked, risks and benefits discussed and monitors and equipment checked Spinal Block Patient position: sitting Prep: site prepped and draped and DuraPrep Patient monitoring: heart rate, cardiac monitor, continuous pulse ox and blood pressure Approach: midline Location: L3-4 Injection technique: single-shot Needle Needle type: Sprotte  Needle gauge: 24 G Needle length: 9 cm Assessment Sensory level: T4

## 2018-06-05 NOTE — Transfer of Care (Signed)
Immediate Anesthesia Transfer of Care Note  Patient: Vickie Maldonado  Procedure(s) Performed: CESAREAN SECTION (N/A Abdomen)  Patient Location: PACU  Anesthesia Type:Spinal  Level of Consciousness: awake, alert  and oriented  Airway & Oxygen Therapy: Patient Spontanous Breathing  Post-op Assessment: Report given to RN and Post -op Vital signs reviewed and stable  Post vital signs: Reviewed and stable  Last Vitals:  Vitals Value Taken Time  BP 122/68 06/05/2018  2:02 AM  Temp    Pulse 86 06/05/2018  2:06 AM  Resp 16 06/05/2018  2:06 AM  SpO2 100 % 06/05/2018  2:06 AM  Vitals shown include unvalidated device data.  Last Pain:  Vitals:   06/04/18 2048  PainSc: 4       Patients Stated Pain Goal: 0 (06/04/18 2048)  Complications: No apparent anesthesia complications

## 2018-06-05 NOTE — Anesthesia Postprocedure Evaluation (Signed)
Anesthesia Post Note  Patient: CAPRIANA OTTINGER  Procedure(s) Performed: CESAREAN SECTION (N/A Abdomen)     Patient location during evaluation: PACU Anesthesia Type: Spinal Level of consciousness: awake and alert Pain management: pain level controlled Vital Signs Assessment: post-procedure vital signs reviewed and stable Respiratory status: spontaneous breathing and respiratory function stable Cardiovascular status: blood pressure returned to baseline and stable Postop Assessment: spinal receding Anesthetic complications: no    Last Vitals:  Vitals:   06/05/18 0536 06/05/18 0616  BP: 122/61 116/68  Pulse: 77 76  Resp: 18 18  Temp: 36.9 C 37 C  SpO2: 98% 98%    Last Pain:  Vitals:   06/05/18 0616  TempSrc: Oral  PainSc: 2    Pain Goal: Patients Stated Pain Goal: 0 (06/04/18 2048)              Epidural/Spinal Function Cutaneous sensation: Normal sensation (06/05/18 0536), Patient able to flex knees: Yes (06/05/18 0536), Patient able to lift hips off bed: Yes (06/05/18 0536), Back pain beyond tenderness at insertion site: No (06/05/18 0536), Progressively worsening motor and/or sensory loss: No (06/05/18 0536), Bowel and/or bladder incontinence post epidural: No (06/05/18 0536)  Lewie Loron

## 2018-06-06 NOTE — Progress Notes (Signed)
No c/o; +flatus, normal bleeding; pain controlled; tol po; ambulating; voiding w/o difficulty Breastfeeding  Temp:  [97.9 F (36.6 C)-98.8 F (37.1 C)] 97.9 F (36.6 C) (02/16 0626) Pulse Rate:  [63-81] 67 (02/16 0626) Resp:  [17-20] 18 (02/16 0626) BP: (108-120)/(54-71) 120/58 (02/16 0626) SpO2:  [98 %] 98 % (02/15 2058)   Intake/Output Summary (Last 24 hours) at 06/06/2018 1111 Last data filed at 06/05/2018 2056 Gross per 24 hour  Intake -  Output 2025 ml  Net -2025 ml   A&ox3 rrr ctab Abd: soft, nt, nd; +bs; fundus firm and 1cm below umb; dressing c/d/i LE: tr edema, nt bilat  CBC Latest Ref Rng & Units 06/05/2018 06/04/2018  WBC 4.0 - 10.5 K/uL 20.9(H) 10.5  Hemoglobin 12.0 - 15.0 g/dL 10.0(L) 13.0  Hematocrit 36.0 - 46.0 % 31.3(L) 40.3  Platelets 150 - 400 K/uL 193 201   A/P: pod 1 s/p 1ltcs for breech 1. Doing well - contin current care 2. Nursing 3. Acute anemia - asymptomatic, plan iron pp 4. Rh pos 5. Rubella immune

## 2018-06-06 NOTE — Lactation Note (Signed)
This note was copied from a baby's chart. Lactation Consultation Note  Patient Name: Vickie Maldonado [id number] Date: 06/06/2018 Reason for consult: Follow-up assessment;Hyperbilirubinemia   P1, Baby 37 hours old on phototherapy. Observed feeding with sucks and swallows. Recommend mother post pump 4-6 times per day for 10-20 min with DEBP on initiation setting. Give baby back volume pumped at the next feeding.  Reviewed spoon feeding.  Reviewed cleaning and milk storage.     Maternal Data Has patient been taught Hand Expression?: Yes  Feeding Feeding Type: Breast Fed  LATCH Score Latch: Grasps breast easily, tongue down, lips flanged, rhythmical sucking.  Audible Swallowing: A few with stimulation  Type of Nipple: Everted at rest and after stimulation  Comfort (Breast/Nipple): Soft / non-tender  Hold (Positioning): No assistance needed to correctly position infant at breast.  LATCH Score: 9  Interventions Interventions: Breast feeding basics reviewed;DEBP  Lactation Tools Discussed/Used Pump Review: Setup, frequency, and cleaning Initiated by:: Dahlia Byes RN IBCLC Date initiated:: 06/06/18   Consult Status Consult Status: Follow-up Date: 06/07/18 Follow-up type: In-patient    Dahlia Byes [facility] 06/06/2018, 1:42 PM

## 2018-06-07 ENCOUNTER — Encounter (HOSPITAL_COMMUNITY): Payer: Self-pay | Admitting: *Deleted

## 2018-06-07 ENCOUNTER — Inpatient Hospital Stay (HOSPITAL_COMMUNITY)
Admission: AD | Admit: 2018-06-07 | Payer: Managed Care, Other (non HMO) | Source: Home / Self Care | Admitting: Obstetrics

## 2018-06-07 ENCOUNTER — Encounter (HOSPITAL_COMMUNITY): Payer: Managed Care, Other (non HMO)

## 2018-06-07 MED ORDER — MAGNESIUM OXIDE 400 (241.3 MG) MG PO TABS
400.0000 mg | ORAL_TABLET | Freq: Every day | ORAL | Status: DC
  Administered 2018-06-07: 400 mg via ORAL
  Filled 2018-06-07 (×2): qty 1

## 2018-06-07 MED ORDER — POLYSACCHARIDE IRON COMPLEX 150 MG PO CAPS
150.0000 mg | ORAL_CAPSULE | Freq: Every day | ORAL | Status: DC
  Administered 2018-06-07: 150 mg via ORAL
  Filled 2018-06-07: qty 1

## 2018-06-07 MED ORDER — TRAMADOL HCL 50 MG PO TABS: 50.0000 mg | ORAL_TABLET | Freq: Four times a day (QID) | ORAL | 0 refills | Status: AC | PRN

## 2018-06-07 MED ORDER — MAGNESIUM OXIDE 400 (241.3 MG) MG PO TABS: 400.0000 mg | ORAL_TABLET | Freq: Every day | ORAL | 0 refills | Status: DC

## 2018-06-07 MED ORDER — IBUPROFEN 800 MG PO TABS: 800.0000 mg | ORAL_TABLET | Freq: Three times a day (TID) | ORAL | 0 refills | Status: DC

## 2018-06-07 NOTE — Progress Notes (Signed)
Mom indicated at 2230 assessment that she had hemorrhoids, but very self-conscious about them.  This nurse, with respect to her, talked with her and was given permission to look at them.  Nurse brought in tucks pads and cream; patient not comfortable having it done for her, so nurse explained how to use them to help with pain and swelling; patient thanked her.   Mom also had been complaining of back pain; sitting in chair and walking helped, as did sleeping on her side for part of the night.

## 2018-06-07 NOTE — Progress Notes (Signed)
POSTOPERATIVE DAY # 2 S/P CS  S:         Reports feeling ok - wants to go home             Tolerating po intake / no nausea / no vomiting / + flatus / no BM             Bleeding is light             Pain controlled with motrin and oxycodone             Up ad lib / ambulatory/ voiding QS  Newborn Breast - milk in  O:  VS: BP 118/70 (BP Location: Left Arm)   Pulse 70   Temp 98 F (36.7 C) (Oral)   Resp 18   Ht 5\' 7"  (1.702 m)   Wt 93 kg   SpO2 98%   Breastfeeding Unknown   BMI 32.11 kg/m   LABS:              Recent Labs    06/04/18 0915 06/05/18 0545  WBC 10.5 20.9*  HGB 13.0 10.0*  PLT 201 193               Bloodtype: --/--/O POS Performed at [facility], [address], [place], [place] [address]  (02/14 0945)  Rubella: Immune (07/30 0000)                                Physical Exam:             Alert and Oriented X3  Abdomen: soft, non-tender, non-distended              Fundus: firm, non-tender, Ueven             Dressing intact with dried drainage              Incision:  approximated with suture / no erythema / no ecchymosis / no drainage  Perineum: intact  Lochia: light  Extremities: trace edema, no calf pain or tenderness, negative Homans  A:        POD # 2 S/P CS            Mild ABL anemia  P:        Routine postoperative care              DC home - WOB booklet - instructions reviewed   Marlinda Mike CNM, MSN, [facility] 06/07/2018, 7:29 AM

## 2018-06-07 NOTE — Lactation Note (Signed)
This note was copied from a baby's chart. Lactation Consultation Note:  Mother reports that her milk has come in.  Mother has 16 ml ebm pumped at the bedside. Mother requesting assistance to give infant ebm with a syringe.   Advised mother to offer breast before pumping and suggested to supplement infant at the breast.   Infant placed in cross cradle hold with #5 fr feeding tube placed along side of breast.  Father at bedside for instructions on assisting mother.  Infant latched and taught parents to flange infants lips for wider gape. Infant took 10 ml ebm. Mother taught to do breast compression. Observed that mothers nipple is slightly compressed.  They are pink and tender. Mother was given comfort gels and advised to phone OB for [facility] Rx.  Infant placed in football on the alternate breast. Infant latched with better depth.  Infant took another 5 ml ebm at the breast with a #5 fr feeding tube,.   Advised mother in reverse pressure and instructions to begin good breast massage then ice to reduce swelling.   Mothers plan is to post pump again now and reserve for next feeding.  Mother has a Spectra pump at home. Advised to pump after each feeding for 15-20 mins.   Advised that infant will continue to cluster feed for the next several nights.  Continue to cue base feed and feed infant 8-12 times or more in 24 hours.  Suggested that mother follow up for a pre and post weight assessment. Mother reports that she prefers to phone office herself.  Mother is aware of available LC services and community support.    Patient Name: Girl Billee Macker [id number] Date: 06/07/2018 Reason for consult: Follow-up assessment   Maternal Data    Feeding Feeding Type: Breast Fed  LATCH Score                   Interventions Interventions: Assisted with latch;Skin to skin;Breast massage;Hand express;Reverse pressure;Breast compression;Adjust position;Support pillows;Position  options;Expressed milk;Comfort gels;Hand pump;DEBP  Lactation Tools Discussed/Used Tools: 39F feeding tube / Syringe Breast pump type: Double-Electric Breast Pump(mom has a Spectra at home)   Consult Status Consult Status: Complete    Michel Bickers 06/07/2018, 1:00 PM

## 2018-06-07 NOTE — Discharge Summary (Signed)
OB Discharge Summary  Patient Name: Vickie Maldonado DOB: 18-Nov-1990 MRN: [id number]  Date of admission: 06/04/2018  Admitting diagnosis: breech Intrauterine pregnancy: [redacted]w[redacted]d     Secondary diagnosis: latent labor   Date of discharge: 06/07/2018    Discharge diagnosis: Term Pregnancy Delivered and POD 2 s/p CS for breech presentation      Prenatal history: G1P1001   EDC : 06/03/2018, by Other Basis  Prenatal care at [facility]  Primary provider : Alquist Prenatal course complicated by breech / (+) GBS carrier  Prenatal Labs: ABO, Rh: --/--/O POS Performed at [facility], [address] [place], [place] [address]  [phone])  Antibody: NEG (02/14 0915) Rubella: Immune (07/30 0000)  RPR: Non Reactive (02/14 0915)  HBsAg: Negative (07/30 0000)  HIV: Non-reactive (07/30 0000)  GBS: Positive (01/10 0000)                                    Hospital course:  Sceduled C/S   28 y.o. yo G1P1001 at [redacted]w[redacted]d was admitted to the hospital 06/04/2018 with onset labor  - planned cesarean section with the following indication:Malpresentation - breech. Membrane Rupture Time/Date: 12:27 AM ,06/05/2018  Patient delivered a Viable infant.06/05/2018 Details of operation can be found in separate operative note.    Patient had an uncomplicated postpartum course.  She is ambulating, tolerating a regular diet, passing flatus, and urinating well. Patient is discharged home in stable condition on  06/07/18 - POD #2.  Delivering PROVIDER: Rhoderick Moody E                                                            Complications: None  Newborn Data: Live born female  Birth Weight: 7 lb 9.7 oz (3450 g) APGAR: 9, 9  Newborn Delivery   Birth date/time:  06/05/2018 00:27:00 Delivery type:  C-Section, Low Transverse C-section categorization:  Primary     Baby Feeding: Breast Disposition:home with mother  Post partum procedures:none  Labs: Lab Results  Component Value  Date   WBC 20.9 (H) 06/05/2018   HGB 10.0 (L) 06/05/2018   HCT 31.3 (L) 06/05/2018   MCV 82.6 06/05/2018   PLT 193 06/05/2018   No flowsheet data found.    Physical Exam @ time of discharge:  Vitals:   06/06/18 0626 06/06/18 1631 06/06/18 2204 06/07/18 0539  BP: (!) 120/58 125/75 117/68 118/70  Pulse: 67 80 88 70  Resp: 18 20 17 18   Temp: 97.9 F (36.6 C) 98.6 F (37 C) 98.4 F (36.9 C) 98 F (36.7 C)  TempSrc: Oral Oral Oral Oral  SpO2:  100% 100% 98%  Weight:      Height:        General: alert, cooperative and no distress Lochia: appropriate Uterine Fundus: firm Incision: Healing well with no significant drainage Extremities: DVT Evaluation: No evidence of DVT seen on physical exam.   Discharge instructions:  "Baby and Me Booklet" and [facility] Booklet  Discharge Medications:  Allergies as of 06/07/2018      Reactions   Peanut-containing Drug Products Anaphylaxis   Latex Hives   Oxycodone Nausea And Vomiting      Medication List  TAKE these medications   acetaminophen 500 MG tablet Commonly known as:  TYLENOL Take 1,000 mg by mouth every 6 (six) hours as needed for moderate pain or headache.   albuterol 108 (90 Base) MCG/ACT inhaler Commonly known as:  PROVENTIL HFA;VENTOLIN HFA Inhale 1-2 puffs into the lungs every 6 (six) hours as needed for wheezing or shortness of breath.   calcium carbonate 500 MG chewable tablet Commonly known as:  TUMS - dosed in mg elemental calcium Chew 2 tablets by mouth daily as needed for indigestion or heartburn.   cetirizine 10 MG tablet Commonly known as:  ZYRTEC Take 10 mg by mouth daily.   ELDERBERRY PO Take 5 mLs by mouth 2 (two) times daily.   FERRALET 90 90-1 MG Tabs Take 1 tablet by mouth daily.   hydrOXYzine 25 MG tablet Commonly known as:  ATARAX/VISTARIL Take 25 mg by mouth daily as needed for itching.   ibuprofen 800 MG tablet Commonly known as:  ADVIL,MOTRIN Take 1 tablet (800 mg total) by mouth  every 8 (eight) hours.   magnesium oxide 400 (241.3 Mg) MG tablet Commonly known as:  MAG-OX Take 1 tablet (400 mg total) by mouth daily.   prenatal multivitamin Tabs tablet Take 1 tablet by mouth at bedtime.   traMADol 50 MG tablet Commonly known as:  ULTRAM Take 1 tablet (50 mg total) by mouth every 6 (six) hours as needed for up to 5 days.            Discharge Care Instructions  (From admission, onward)         Start     Ordered   06/07/18 0000  Discharge wound care:    Comments:  Leave honeycomb in place for 5 days - remove if get wet in shower. Leave steri-strips in place x 2 weeks. Keep incision clean and dry   06/07/18 0939   06/07/18 0000  Discharge wound care:    Comments:  Leave honeycomb in place for 5 days - remove if get wet in shower. Leave steri-strips in place x 2 weeks. Keep incision clean and dry   06/07/18 1045          Diet: routine diet  Activity: Advance as tolerated. Pelvic rest x 6 weeks.   Follow up:6 weeks    Signed: Marlinda Mike CNM, MSN, [facility] 06/07/2018, 10:45 AM

## 2018-07-02 ENCOUNTER — Telehealth (HOSPITAL_COMMUNITY): Payer: Self-pay

## 2018-07-02 ENCOUNTER — Telehealth (HOSPITAL_COMMUNITY): Payer: Self-pay | Admitting: Lactation Services

## 2018-07-02 NOTE — Telephone Encounter (Signed)
Mom with multiple lactation issues.  Mom currently being treated for yeast.  Mom reports [name] is not gaining weight well and mom has been offering 1 oz of breastmilk past each breastfeed.  Mom reports at last appt she was still not back up to birthweight. Mom reports she started getting sharpe pains in her breasts.  Especially during feeding. Mom reports that her nipples are also red and itchy.  Inquired how mom knew she had yeast.  Doctor did not see her.  Just called her in something because of corona virrus trying to limit drs visits.Mom reports fussiness at the breast for Darci started about the same time as she got yeast but that she doesn't have it.  Baby not being treated.   Mom reports that she uses a [place] at the breast and that is how she collects milk for her post breastfeeding feeds.  Mom reports she does not let down with her DEBP.  Mom reports not able to pump anything with it.  Mom reports she has tried to pump two times with it.  Urged mom to see outpatient lactation since she had so many things going on right now.  Referred her to outpatient lactation.

## 2018-07-02 NOTE — Telephone Encounter (Signed)
Mom called with lactation concern.  Baby was born 06/05/18 and diagnosed with a lip tie.  Baby is pulling off breast and not gaining well.  I called her back and got her voice mail.  I recommended she call for an outpatient appointment for a full evaluation and feeding assessment.

## 2019-04-27 ENCOUNTER — Ambulatory Visit: Payer: Managed Care, Other (non HMO) | Attending: Internal Medicine

## 2019-04-27 DIAGNOSIS — Z20822 Contact with and (suspected) exposure to covid-19: Secondary | ICD-10-CM

## 2019-04-28 LAB — NOVEL CORONAVIRUS, NAA: SARS-CoV-2, NAA: NOT DETECTED

## 2019-06-22 ENCOUNTER — Encounter (HOSPITAL_COMMUNITY): Payer: Self-pay

## 2019-06-22 ENCOUNTER — Other Ambulatory Visit: Payer: Self-pay

## 2019-06-22 ENCOUNTER — Emergency Department (HOSPITAL_COMMUNITY): Payer: Managed Care, Other (non HMO)

## 2019-06-22 ENCOUNTER — Emergency Department (HOSPITAL_COMMUNITY)
Admission: EM | Admit: 2019-06-22 | Discharge: 2019-06-22 | Disposition: A | Discharge status: Home / Self Care | Payer: Managed Care, Other (non HMO) | Attending: Emergency Medicine | Admitting: Emergency Medicine

## 2019-06-22 ENCOUNTER — Ambulatory Visit (HOSPITAL_COMMUNITY)
Admission: EM | Admit: 2019-06-22 | Discharge: 2019-06-22 | Disposition: A | Discharge status: Home / Self Care | Payer: Managed Care, Other (non HMO) | Source: Home / Self Care

## 2019-06-22 DIAGNOSIS — N939 Abnormal uterine and vaginal bleeding, unspecified: Secondary | ICD-10-CM | POA: Insufficient documentation

## 2019-06-22 DIAGNOSIS — N840 Polyp of corpus uteri: Secondary | ICD-10-CM | POA: Diagnosis not present

## 2019-06-22 DIAGNOSIS — R531 Weakness: Secondary | ICD-10-CM | POA: Diagnosis not present

## 2019-06-22 DIAGNOSIS — Z91040 Latex allergy status: Secondary | ICD-10-CM | POA: Diagnosis not present

## 2019-06-22 DIAGNOSIS — R42 Dizziness and giddiness: Secondary | ICD-10-CM | POA: Diagnosis not present

## 2019-06-22 DIAGNOSIS — Z91010 Allergy to peanuts: Secondary | ICD-10-CM | POA: Diagnosis not present

## 2019-06-22 LAB — CBC WITH DIFFERENTIAL/PLATELET
Abs Immature Granulocytes: 0.03 10*3/uL (ref 0.00–0.07)
Basophils Absolute: 0.1 10*3/uL (ref 0.0–0.1)
Basophils Relative: 1 %
Eosinophils Absolute: 0.2 10*3/uL (ref 0.0–0.5)
Eosinophils Relative: 2 %
HCT: 40.9 % (ref 36.0–46.0)
Hemoglobin: 12.7 g/dL (ref 12.0–15.0)
Immature Granulocytes: 0 %
Lymphocytes Relative: 29 %
Lymphs Abs: 3.1 10*3/uL (ref 0.7–4.0)
MCH: 25 pg — ABNORMAL LOW (ref 26.0–34.0)
MCHC: 31.1 g/dL (ref 30.0–36.0)
MCV: 80.4 fL (ref 80.0–100.0)
Monocytes Absolute: 0.7 10*3/uL (ref 0.1–1.0)
Monocytes Relative: 6 %
Neutro Abs: 6.7 10*3/uL (ref 1.7–7.7)
Neutrophils Relative %: 62 %
Platelets: 368 10*3/uL (ref 150–400)
RBC: 5.09 MIL/uL (ref 3.87–5.11)
RDW: 14.4 % (ref 11.5–15.5)
WBC: 10.7 10*3/uL — ABNORMAL HIGH (ref 4.0–10.5)
nRBC: 0 % (ref 0.0–0.2)

## 2019-06-22 LAB — ABO/RH: ABO/RH(D): O POS

## 2019-06-22 LAB — TYPE AND SCREEN
ABO/RH(D): O POS
Antibody Screen: NEGATIVE

## 2019-06-22 NOTE — ED Notes (Signed)
ED Provider at bedside. 

## 2019-06-22 NOTE — Discharge Instructions (Signed)
You were seen in the emergency department today with vaginal bleeding.  You have a polyp that was seen on ultrasound in the uterus.  This sometimes can cause bleeding and requires further treatment by your OB/GYN.  Please call the office tomorrow morning to discuss this finding on your ultrasound and plan further management.  Please continue your medications as prescribed.  Return to the emergency department any new or suddenly worsening symptoms.

## 2019-06-22 NOTE — Discharge Instructions (Addendum)
Go to the ER for further evaluation and treatment. 

## 2019-06-22 NOTE — ED Provider Notes (Signed)
Emergency Department Provider Note   I have reviewed the triage vital signs and the nursing notes.   HISTORY  Chief Complaint Vaginal Bleeding   HPI Vickie Maldonado is a 29 y.o. female with PMH of anemia presents to the emergency department for evaluation of heavy vaginal bleeding over the past 7 to 10 days.  Her IUD was removed by her OB/GYN Dr. Ernestina Penna on 2/22.  The IUD was placed a year prior and has had intermittent bleeding issues since placement which is why it was removed.  She was placed on Provera 10 mg daily and has been taking it for the past 5 days but feels like her bleeding and passage of clots have worsened. She is going through approx 26 pads daily. They are not all soaked but sometimes are thrown away with clotting. She feels some generalized weakness and occasional lightheadedness.  She denies any chest pain or shortness of breath. No fever.  She initially presented to urgent care and was referred to the emergency department for further testing.    Past Medical History:  Diagnosis Date  . Anemia   . Headache     Patient Active Problem List   Diagnosis Date Noted  . Postpartum care following cesarean delivery (2/15) 06/05/2018  . Breech presentation 06/04/2018    Past Surgical History:  Procedure Laterality Date  . CESAREAN SECTION N/A 06/04/2018   Procedure: CESAREAN SECTION;  Surgeon: [name], MD;  Location: [facility]IRTHING SU[facility];  Service: Obstetrics;  Laterality: N/A;  . WISDOM TOOTH EXTRACTION      Allergies Peanut-containing drug products, Latex, and Oxycodone  Family History  Problem Relation Age of Onset  . Hypertension Father   . Breast cancer Maternal Aunt   . Breast cancer Maternal Grandmother   . Hypertension Paternal Grandfather   . Hypertension Mother     Social History Social History   Tobacco Use  . Smoking status: Never Smoker  . Smokeless tobacco: Never Used  Substance Use Topics  . Alcohol use: Not Currently  .  Drug use: Never    Review of Systems  Constitutional: No fever/chills. Intermittent lightheadedness.  Eyes: No visual changes. ENT: No sore throat. Cardiovascular: Denies chest pain. Respiratory: Denies shortness of breath. Gastrointestinal: No abdominal pain.  No nausea, no vomiting.  No diarrhea.  No constipation. Genitourinary: Negative for dysuria. Heavy vaginal bleeding.  Musculoskeletal: Negative for back pain. Skin: Negative for rash. Neurological: Negative for headaches, focal weakness or numbness.  10-point ROS otherwise negative.  ____________________________________________   PHYSICAL EXAM:  VITAL SIGNS: ED Triage Vitals  Enc Vitals Group     BP 06/22/19 1643 128/78     Pulse Rate 06/22/19 1643 75     Resp 06/22/19 1643 18     Temp 06/22/19 1643 98.7 F (37.1 C)     Temp Source 06/22/19 1643 Oral     SpO2 06/22/19 1643 99 %     Weight 06/22/19 1646 185 lb (83.9 kg)     Height 06/22/19 1646 5\' 7"  (1.702 m)   Constitutional: Alert and oriented. Well appearing and in no acute distress. Eyes: Conjunctivae are normal.  Head: Atraumatic. Nose: No congestion/rhinnorhea. Mouth/Throat: Mucous membranes are moist.   Neck: No stridor.  Cardiovascular: Normal rate, regular rhythm.  Respiratory: Normal respiratory effort.  Gastrointestinal: Soft and nontender. No distention.  Genitourinary: Chaperone assisting with exam which was performed with patient's verbal consent. Small volume blood in the vaginal vault with small clot which was removed.  No visible cervical lesion/injury. Blood coming through os. Moderate tenderness in the right adnexa.  Musculoskeletal: No gross deformities of extremities. Neurologic:  Normal speech and language. Skin:  Skin is warm, dry and intact. No rash noted.  ____________________________________________   LABS (all labs ordered are listed, but only abnormal results are displayed)  Labs Reviewed  CBC WITH DIFFERENTIAL/PLATELET -  Abnormal; Notable for the following components:      Result Value   WBC 10.7 (*)    MCH 25.0 (*)    All other components within normal limits  POC URINE PREG, ED  TYPE AND SCREEN  ABO/RH   ________________________________________  RADIOLOGY  TVUS reviewed.  ____________________________________________   PROCEDURES  Procedure(s) performed:   Procedures   ____________________________________________   INITIAL IMPRESSION / ASSESSMENT AND PLAN / ED COURSE  Pertinent labs & imaging results that were available during my care of the patient were reviewed by me and considered in my medical decision making (see chart for details).   Patient presents to the emergency department for evaluation of heavy vaginal bleeding after IUD removal on 22 February.  Her OB/GYN is Dr. Valentino Saxon. Mild/moderate blood in pelvic exam. Labs without anemia requiring PRBC transfusion.   TVUS reviewed with endometrial polyp. Plan for OB f/u for polyp treatment. Continue OCP medication. Discussed ED return precautions.  ____________________________________________  FINAL CLINICAL IMPRESSION(S) / ED DIAGNOSES  Final diagnoses:  Abnormal uterine bleeding due to endometrial polyp    Note:  This document was prepared using Dragon voice recognition software and may include unintentional dictation errors.  Nanda Quinton, MD, [facility] Emergency Medicine    Lashaunta Sicard, W[facility], MD 06/25/19 [phone]

## 2019-06-22 NOTE — ED Triage Notes (Signed)
Pt presents with complaints of vaginal bleeding that started on 2/25. She had her IUD removed on 2/22. Pt states that the bleeding is very heavy. Reports golf ball size and bigger clots. States that she saw her OBGYN on Monday and was started on progesterone. Reports that the symptoms are worse since beginning the medication. Reports cramps that are off and on.

## 2019-06-22 NOTE — ED Triage Notes (Signed)
Patient reports heavy vaginal bleeding since 06/16/19. Patient had a her IUD removed on 06/13/19. Patient saw her OB/GYN 2 days and was started on Progesterone. Patient states the vaginal bleeding is worse and is now having clots.

## 2019-06-22 NOTE — ED Provider Notes (Signed)
[facility]    CSN: [id number] Arrival date & time: 06/22/19  1501      History   Chief Complaint Chief Complaint  Patient presents with  . Vaginal Bleeding    HPI Vickie Maldonado is a 29 y.o. female.   Reports abnormal and heavy vaginal bleeding for the last 7 days.  Reports clots that have been the size of an orange, and has gone through 26 pads in the last 12 hours.  Reports that this started the day that she had her IUD removed.  Reports that she is spoken with her OB, and that she has not been told to come into the office to have this checked.  Reports dizziness as well, she is concerned that her iron level may be dropping.  Denies headache, fever, abdominal pain, rash, nausea, vomiting, diarrhea, chills, body aches, other symptoms.  ROS Per HPI  The history is provided by the patient.    Past Medical History:  Diagnosis Date  . Anemia   . Headache     Patient Active Problem List   Diagnosis Date Noted  . Postpartum care following cesarean delivery (2/15) 06/05/2018  . Breech presentation 06/04/2018    Past Surgical History:  Procedure Laterality Date  . CESAREAN SECTION N/A 06/04/2018   Procedure: CESAREAN SECTION;  Surgeon: [name], MD;  Location: [facility] BIRTHING SUIT[facility];  Service: Obstetrics;  Laterality: N/A;  . WISDOM TOOTH EXTRACTION      OB History    Gravida  1   Para  1   Term  1   Preterm      AB      Living  1     SAB      TAB      Ectopic      Multiple  0   Live Births  1            Home Medications    Prior to Admission medications   Medication Sig Start Date End Date Taking? Authorizing Provider  cetirizine (ZYRTEC) 10 MG tablet Take 10 mg by mouth daily.   Yes [provider]  Fe Cbn-Fe Gluc-FA-B12-C-DSS (FERRALET 90) 90-1 MG TABS Take 1 tablet by mouth daily.   Yes [provider]  medroxyPROGESTERone (PROVERA) 10 MG tablet Take 10 mg by mouth daily.   Yes [provider]    montelukast (SINGULAIR) 10 MG tablet Take 10 mg by mouth at bedtime.   Yes [provider]  sertraline (ZOLOFT) 100 MG tablet Take 100 mg by mouth daily.   Yes [provider]  acetaminophen (TYLENOL) 500 MG tablet Take 1,000 mg by mouth every 6 (six) hours as needed for moderate pain or headache.    [provider]  albuterol (PROVENTIL HFA;VENTOLIN HFA) 108 (90 Base) MCG/ACT inhaler Inhale 1-2 puffs into the lungs every 6 (six) hours as needed for wheezing or shortness of breath.    [provider]  calcium carbonate (TUMS - DOSED IN MG ELEMENTAL CALCIUM) 500 MG chewable tablet Chew 2 tablets by mouth daily as needed for indigestion or heartburn.    [provider]  ELDERBERRY PO Take 5 mLs by mouth 2 (two) times daily.    [provider]  hydrOXYzine (ATARAX/VISTARIL) 25 MG tablet Take 25 mg by mouth daily as needed for itching.  05/05/18   [provider]  ibuprofen (ADVIL,MOTRIN) 800 MG tablet Take 1 tablet (800 mg total) by mouth every 8 (eight) hours. 06/07/18  Marlinda Mike, CNM  magnesium oxide (MAG-OX) 400 (241.3 Mg) MG tablet Take 1 tablet (400 mg total) by mouth daily. 06/07/18   Marlinda Mike, CNM  Prenatal Vit-Fe Fumarate-FA (PRENATAL MULTIVITAMIN) TABS tablet Take 1 tablet by mouth at bedtime.    [provider]    Family History Family History  Problem Relation Age of Onset  . Hypertension Father   . Breast cancer Maternal Aunt   . Breast cancer Maternal Grandmother   . Hypertension Paternal Grandfather   . Hypertension Mother     Social History Social History   Tobacco Use  . Smoking status: Never Smoker  . Smokeless tobacco: Never Used  Substance Use Topics  . Alcohol use: Not Currently  . Drug use: Never     Allergies   Peanut-containing drug products, Latex, and Oxycodone   Review of Systems Review of Systems   Physical Exam Triage Vital Signs ED Triage Vitals  Enc Vitals Group      BP 06/22/19 1544 124/80     Pulse Rate 06/22/19 1544 98     Resp 06/22/19 1544 18     Temp 06/22/19 1544 98.7 F (37.1 C)     Temp src --      SpO2 06/22/19 1544 98 %     Weight --      Height --      Head Circumference --      Peak Flow --      Pain Score 06/22/19 1540 2     Pain Loc --      Pain Edu? --      Excl. in GC? --    No data found.  Updated Vital Signs BP 124/80   Pulse 98   Temp 98.7 F (37.1 C)   Resp 18   LMP 06/22/2019   SpO2 98%   Visual Acuity Right Eye Distance:   Left Eye Distance:   Bilateral Distance:    Right Eye Near:   Left Eye Near:    Bilateral Near:     Physical Exam Vitals and nursing note reviewed.  Constitutional:      General: She is not in acute distress.    Appearance: Normal appearance. She is well-developed and normal weight.  HENT:     Head: Normocephalic and atraumatic.  Eyes:     Conjunctiva/sclera: Conjunctivae normal.  Cardiovascular:     Rate and Rhythm: Normal rate and regular rhythm.     Heart sounds: No murmur.  Pulmonary:     Effort: Pulmonary effort is normal. No respiratory distress.     Breath sounds: Normal breath sounds.  Abdominal:     General: Bowel sounds are normal. There is no distension.     Palpations: Abdomen is soft. There is no mass.     Tenderness: There is no abdominal tenderness. There is no guarding or rebound.     Hernia: No hernia is present.  Musculoskeletal:        General: Normal range of motion.     Cervical back: Normal range of motion and neck supple.  Skin:    General: Skin is warm and dry.     Capillary Refill: Capillary refill takes less than 2 seconds.  Neurological:     General: No focal deficit present.     Mental Status: She is alert and oriented to person, place, and time.  Psychiatric:        Mood and Affect: Mood normal.  Behavior: Behavior normal.      UC Treatments / Results  Labs (all labs ordered are listed, but only abnormal results are  displayed) Labs Reviewed - No data to display  EKG   Radiology No results found.  Procedures Procedures (including critical care time)  Medications Ordered in UC Medications - No data to display  Initial Impression / Assessment and Plan / UC Course  I have reviewed the triage vital signs and the nursing notes.  Pertinent labs & imaging results that were available during my care of the patient were reviewed by me and considered in my medical decision making (see chart for details).     Abnormal vaginal bleeding, patient has photos of orange-sized clots in her pad.  Dizziness, could be from blood loss for the past 7 days.  Advised to go to the ER, for further evaluation and treatment.  Advised to ask for pelvic ultrasound. Final Clinical Impressions(s) / UC Diagnoses   Final diagnoses:  Vaginal bleeding     Discharge Instructions     Go to the ER for further evaluation and treatment.     ED Prescriptions    None     I have reviewed the PDMP during this encounter.   Faustino Congress, NP 06/22/19 1717

## 2020-01-15 ENCOUNTER — Other Ambulatory Visit (HOSPITAL_COMMUNITY): Payer: Self-pay | Admitting: Nurse Practitioner

## 2020-01-15 DIAGNOSIS — U071 COVID-19: Secondary | ICD-10-CM

## 2020-01-15 NOTE — Progress Notes (Signed)
I connected by phone with Vickie Maldonado on 01/15/2020 at 2:27 PM to discuss the potential use of a new treatment for mild to moderate COVID-19 viral infection in non-hospitalized patients.  This patient is a 29 y.o. female that meets the FDA criteria for Emergency Use Authorization of COVID monoclonal antibody casirivimab/imdevimab or bamlanivimab/eteseviamb.  Has a (+) direct SARS-CoV-2 viral test result  Has mild or moderate COVID-19   Is NOT hospitalized due to COVID-19  Is within 10 days of symptom onset  Has at least one of the high risk factor(s) for progression to severe COVID-19 and/or hospitalization as defined in EUA.  Specific high risk criteria : Pregnancy and Chronic Lung Disease   I have spoken and communicated the following to the patient or parent/caregiver regarding COVID monoclonal antibody treatment:  1. FDA has authorized the emergency use for the treatment of mild to moderate COVID-19 in adults and pediatric patients with positive results of direct SARS-CoV-2 viral testing who are 57 years of age and older weighing at least 40 kg, and who are at high risk for progressing to severe COVID-19 and/or hospitalization.  2. The significant known and potential risks and benefits of COVID monoclonal antibody, and the extent to which such potential risks and benefits are unknown.  3. Information on available alternative treatments and the risks and benefits of those alternatives, including clinical trials.  4. Patients treated with COVID monoclonal antibody should continue to self-isolate and use infection control measures (e.g., wear mask, isolate, social distance, avoid sharing personal items, clean and disinfect "high touch" surfaces, and frequent handwashing) according to CDC guidelines.   5. The patient or parent/caregiver has the option to accept or refuse COVID monoclonal antibody treatment.  After reviewing this information with the patient, the patient has agreed to  receive one of the available covid 19 monoclonal antibodies and will be provided an appropriate fact sheet prior to infusion. Vickie Agee, NP 01/15/2020 2:27 PM

## 2020-01-16 ENCOUNTER — Ambulatory Visit (HOSPITAL_COMMUNITY)
Admission: RE | Admit: 2020-01-16 | Discharge: 2020-01-16 | Disposition: A | Discharge status: Home / Self Care | Payer: Managed Care, Other (non HMO) | Source: Ambulatory Visit | Attending: Pulmonary Disease | Admitting: Pulmonary Disease

## 2020-01-16 ENCOUNTER — Other Ambulatory Visit (HOSPITAL_COMMUNITY): Payer: Self-pay

## 2020-01-16 DIAGNOSIS — U071 COVID-19: Secondary | ICD-10-CM

## 2020-01-16 MED ORDER — SODIUM CHLORIDE 0.9 % IV SOLN
1200.0000 mg | Freq: Once | INTRAVENOUS | Status: AC
  Administered 2020-01-16: 1200 mg via INTRAVENOUS

## 2020-01-16 MED ORDER — METHYLPREDNISOLONE SODIUM SUCC 125 MG IJ SOLR: 125.0000 mg | Freq: Once | INTRAMUSCULAR | Status: DC | PRN

## 2020-01-16 MED ORDER — FAMOTIDINE IN NACL 20-0.9 MG/50ML-% IV SOLN: 20.0000 mg | Freq: Once | INTRAVENOUS | Status: DC | PRN

## 2020-01-16 MED ORDER — ALBUTEROL SULFATE HFA 108 (90 BASE) MCG/ACT IN AERS: 2.0000 | INHALATION_SPRAY | Freq: Once | RESPIRATORY_TRACT | Status: DC | PRN

## 2020-01-16 MED ORDER — DIPHENHYDRAMINE HCL 50 MG/ML IJ SOLN: 50.0000 mg | Freq: Once | INTRAMUSCULAR | Status: DC | PRN

## 2020-01-16 MED ORDER — SODIUM CHLORIDE 0.9 % IV SOLN: INTRAVENOUS | Status: DC | PRN

## 2020-01-16 MED ORDER — EPINEPHRINE 0.3 MG/0.3ML IJ SOAJ: 0.3000 mg | Freq: Once | INTRAMUSCULAR | Status: DC | PRN

## 2020-01-16 NOTE — Progress Notes (Signed)
°  Diagnosis: COVID-19 ° °Physician: Wright  ° ° °Procedure: [facility] Infusion Clinic Med: casirivimab\imdevimab infusion - Provided patient with casirivimab\imdevimab fact sheet for patients, parents and caregivers prior to infusion. ° °Complications: No immediate complications noted. ° °Discharge: Discharged home  ° °Vickie Maldonado °01/16/2020 °\ ° °

## 2020-01-16 NOTE — Discharge Instructions (Signed)

## 2020-02-29 NOTE — Progress Notes (Signed)
Patient referred by Vickie Raider, MD for palpitations  Subjective:   Vickie Maldonado, female    DOB: 08-Jul-1990, 29 y.o.   MRN: [id number]   Chief Complaint  Patient presents with  . New Patient (Initial Visit)     HPI  29 y.o. Caucasian female with second trimester pregnancy, referred for palpitations, shortness of breath.  Patient is a IT consultant, works at a [employer].  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  For the last 2 weeks, patient has had tachycardia and exertional dyspnea with minimal activity.  She did not have any such symptoms prior to the past 2 weeks, or in her first pregnancy.  Her resting heart rate has been around 80 bpm, until for the past 2 weeks.  She drove to [place] to visit family on 02/18/2020.  On returning home that evening, she felt her heart rate going fast, with Apple Watch registering heart rate of 180 bpm.  She experienced shortness of breath with climbing up a flight of stairs.  Tachycardia and exertional dyspnea having stayed more or less persistent for the past 2 weeks.  She denies any chest pain.  Of note, patient had Covid in September 2021, and received monoclonal antibodies.  Her symptoms at that time were primarily shortness of breath, which then resolved, only to return for the past 2 weeks.  She does not have any personal or family history of DVT or PE.  She drinks one coffee per day.  She does not drink any other caffeinated drinks or stimulants.  She has had iron deficiency anemia for a long time, and has in fact improved recently with iron supplements.   Past Medical History:  Diagnosis Date  . Anemia   . Headache      Past Surgical History:  Procedure Laterality Date  . CESAREAN SECTION N/A 06/04/2018   Procedure: CESAREAN SECTION;  Surgeon: [name], MD;  Location: [facility] BIRTHING SUITES;  Service: Obstetrics;  Laterality: N/A;  . WISDOM TOOTH EXTRACTION        Social History   Tobacco Use  Smoking Status Never Smoker  Smokeless Tobacco Never Used    Social History   Substance and Sexual Activity  Alcohol Use Not Currently     Family History  Problem Relation Age of Onset  . Hypertension Father   . Breast cancer Maternal Aunt   . Breast cancer Maternal Grandmother   . Hypertension Paternal Grandfather   . Hypertension Mother      Current Outpatient Medications on File Prior to Visit  Medication Sig Dispense Refill  . acetaminophen (TYLENOL) 500 MG tablet Take 1,000 mg by mouth every 6 (six) hours as needed for moderate pain or headache.    . albuterol (PROVENTIL HFA;VENTOLIN HFA) 108 (90 Base) MCG/ACT inhaler Inhale 1-2 puffs into the lungs every 6 (six) hours as needed for wheezing or shortness of breath.    . calcium carbonate (TUMS - DOSED IN MG ELEMENTAL CALCIUM) 500 MG chewable tablet Chew 2 tablets by mouth daily as needed for indigestion or heartburn.    . cetirizine (ZYRTEC) 10 MG tablet Take 10 mg by mouth daily.    Marland Kitchen ELDERBERRY PO Take 5 mLs by mouth 2 (two) times daily.    Marland Kitchen Fe Cbn-Fe Gluc-FA-B12-C-DSS (FERRALET 90) 90-1 MG TABS Take 1 tablet by mouth daily.    . hydrOXYzine (ATARAX/VISTARIL) 25 MG tablet Take 25 mg by mouth daily  as needed for itching.     Marland Kitchen ibuprofen (ADVIL,MOTRIN) 800 MG tablet Take 1 tablet (800 mg total) by mouth every 8 (eight) hours. 30 tablet 0  . magnesium oxide (MAG-OX) 400 (241.3 Mg) MG tablet Take 1 tablet (400 mg total) by mouth daily. 45 tablet 0  . medroxyPROGESTERone (PROVERA) 10 MG tablet Take 10 mg by mouth daily.    . montelukast (SINGULAIR) 10 MG tablet Take 10 mg by mouth at bedtime.    . Prenatal Vit-Fe Fumarate-FA (PRENATAL MULTIVITAMIN) TABS tablet Take 1 tablet by mouth at bedtime.    . sertraline (ZOLOFT) 100 MG tablet Take 100 mg by mouth daily.     No current facility-administered medications on file prior to visit.    Cardiovascular and other pertinent  studies:  EKG 03/01/2020: Sinus tachycardia 119 bpm Nonspecific T wave inversion inferior leads   Recent labs: 01/30/2020: H/H 11.5/34.5. MCV 78.1. Platelets 264    Review of Systems  Cardiovascular: Positive for dyspnea on exertion and palpitations. Negative for chest pain, leg swelling and syncope.         Vitals:   03/01/20 0835  BP: 131/81  Pulse: 92  Resp: 16  SpO2: 97%     Body mass index is 31.64 kg/m. Filed Weights   03/01/20 0835  Weight: 202 lb (91.6 kg)     Objective:   Physical Exam Vitals and nursing note reviewed.  Constitutional:      General: She is not in acute distress. Neck:     Vascular: No JVD.  Cardiovascular:     Rate and Rhythm: Regular rhythm. Tachycardia present.     Heart sounds: Normal heart sounds. No murmur heard.   Pulmonary:     Effort: Pulmonary effort is normal.     Breath sounds: Normal breath sounds. No wheezing or rales.         Assessment & Recommendations:   29 y.o. Caucasian female with second trimester pregnancy, referred for palpitations, shortness of breath.  Resting tachycardia and exertional dyspnea are concerning for a common etiology.  Physical exam is normal.  EKG shows sinus tachycardia with T wave inversion in inferior leads.  Recommend checking TSH, T4.  Findings also concerning for PE.  I will start with D-dimer and chest x-ray.  If chest x-ray is normal and D-dimer is abnormal, will obtain VQ scan.  Other differential remains long Covid symptoms. Further recommendations after above testing.    Thank you for referring the patient to [place]. Please feel free to contact with any questions.   [name], MD Pager: [phone] Office: [phone]

## 2020-03-01 ENCOUNTER — Ambulatory Visit
Admission: RE | Admit: 2020-03-01 | Discharge: 2020-03-01 | Disposition: A | Discharge status: Home / Self Care | Payer: Managed Care, Other (non HMO) | Source: Ambulatory Visit | Attending: Cardiology | Admitting: Cardiology

## 2020-03-01 ENCOUNTER — Encounter: Payer: Self-pay | Admitting: Cardiology

## 2020-03-01 ENCOUNTER — Other Ambulatory Visit: Payer: Self-pay

## 2020-03-01 ENCOUNTER — Ambulatory Visit: Payer: Managed Care, Other (non HMO) | Admitting: Cardiology

## 2020-03-01 ENCOUNTER — Other Ambulatory Visit: Payer: Self-pay | Admitting: Cardiology

## 2020-03-01 VITALS — BP 131/81 | HR 92 | Resp 16 | Ht 67.0 in | Wt 202.0 lb

## 2020-03-01 DIAGNOSIS — R0600 Dyspnea, unspecified: Secondary | ICD-10-CM

## 2020-03-01 DIAGNOSIS — Z3492 Encounter for supervision of normal pregnancy, unspecified, second trimester: Secondary | ICD-10-CM | POA: Insufficient documentation

## 2020-03-01 DIAGNOSIS — R0609 Other forms of dyspnea: Secondary | ICD-10-CM

## 2020-03-01 DIAGNOSIS — R002 Palpitations: Secondary | ICD-10-CM | POA: Insufficient documentation

## 2020-03-01 DIAGNOSIS — R000 Tachycardia, unspecified: Secondary | ICD-10-CM

## 2020-03-01 NOTE — Addendum Note (Signed)
Addended by: [name] on: 03/01/2020 08:22 PM   Modules accepted: Orders

## 2020-03-02 ENCOUNTER — Ambulatory Visit: Payer: Managed Care, Other (non HMO)

## 2020-03-02 DIAGNOSIS — R0609 Other forms of dyspnea: Secondary | ICD-10-CM

## 2020-03-02 DIAGNOSIS — R0600 Dyspnea, unspecified: Secondary | ICD-10-CM

## 2020-03-02 LAB — TSH+FREE T4
Free T4: 0.94 ng/dL (ref 0.82–1.77)
TSH: 1.21 u[IU]/mL (ref 0.450–4.500)

## 2020-03-02 NOTE — Telephone Encounter (Signed)
Spoke to the patient and explained chest xray and echocardiogram results. She may need further pulmonary workup.   Thanks MJP

## 2020-03-06 ENCOUNTER — Other Ambulatory Visit: Payer: Self-pay | Admitting: Cardiology

## 2020-03-06 DIAGNOSIS — R918 Other nonspecific abnormal finding of lung field: Secondary | ICD-10-CM

## 2020-03-06 DIAGNOSIS — R0600 Dyspnea, unspecified: Secondary | ICD-10-CM

## 2020-03-06 DIAGNOSIS — R0609 Other forms of dyspnea: Secondary | ICD-10-CM

## 2020-03-09 ENCOUNTER — Other Ambulatory Visit: Payer: Self-pay

## 2020-03-09 ENCOUNTER — Ambulatory Visit: Payer: Managed Care, Other (non HMO) | Admitting: Pulmonary Disease

## 2020-03-09 ENCOUNTER — Encounter: Payer: Self-pay | Admitting: Pulmonary Disease

## 2020-03-09 VITALS — BP 122/80 | HR 94 | Temp 98.7°F | Ht 66.0 in | Wt 203.8 lb

## 2020-03-09 DIAGNOSIS — R0602 Shortness of breath: Secondary | ICD-10-CM | POA: Diagnosis not present

## 2020-03-09 LAB — D-DIMER, QUANTITATIVE: D-Dimer, Quant: 1.12 mcg/mL FEU — ABNORMAL HIGH (ref <0.50)

## 2020-03-09 MED ORDER — AZITHROMYCIN 250 MG PO TABS: ORAL_TABLET | ORAL | 0 refills | Status: DC

## 2020-03-09 NOTE — Patient Instructions (Signed)
Shortness of breath, abnormal chest x-ray Late pregnancy  D-dimer Ultrasound of the extremity to rule out DVT  Azithromycin 500-day one and 250 daily for 4 days  Follow-up in 4 weeks

## 2020-03-09 NOTE — Progress Notes (Signed)
Vickie Maldonado    [id number]    08-18-1990  Primary Care Physician:Shaw, Rockney Ghee, MD  Referring Physician: [name], MD [address] [place],  [place] [address]  Chief complaint:   Patient being seen for abnormal chest x-ray Shortness of breath  HPI:  Has been having shortness of breath for a few weeks Did have Covid in September-did receive monoclonal antibody  About [redacted] weeks pregnant  With shortness of breath increase she had a chest x-ray performed showing reticulonodular changes at the right base  She does not recollect having chest x-rays performed when she had Covid the month previously  She does have shortness of breath She also feels her heart is racing sometimes  Did follow-up with cardiology  He does have some leg swelling  Did not have similar symptoms during previous pregnancy Very minimal cough, not feeling acutely ill  Denies any chest pains, no lightheadedness Able to tolerate activities of daily living at a slower pace  Outpatient Encounter Medications as of 03/09/2020  Medication Sig  . acetaminophen (TYLENOL) 500 MG tablet Take 1,000 mg by mouth every 6 (six) hours as needed for moderate pain or headache.  . albuterol (PROVENTIL HFA;VENTOLIN HFA) 108 (90 Base) MCG/ACT inhaler Inhale 1-2 puffs into the lungs every 6 (six) hours as needed for wheezing or shortness of breath.  . calcium carbonate (TUMS - DOSED IN MG ELEMENTAL CALCIUM) 500 MG chewable tablet Chew 2 tablets by mouth daily as needed for indigestion or heartburn.  . cetirizine (ZYRTEC) 10 MG tablet Take 10 mg by mouth daily.  . famotidine (PEPCID) 20 MG tablet Take 20 mg by mouth 2 (two) times daily.  . Prenatal Vit-Fe Fumarate-FA (PRENATAL MULTIVITAMIN) TABS tablet Take 1 tablet by mouth at bedtime.  . sertraline (ZOLOFT) 100 MG tablet Take 100 mg by mouth daily.   No facility-administered encounter medications on file as of 03/09/2020.    Allergies as  of 03/09/2020 - Review Complete 03/09/2020  Allergen Reaction Noted  . Peanut-containing drug products Anaphylaxis 06/05/2018  . Latex Hives 06/04/2018  . Oxycodone Nausea And Vomiting 05/18/2018    Past Medical History:  Diagnosis Date  . Anemia   . Headache     Past Surgical History:  Procedure Laterality Date  . CESAREAN SECTION N/A 06/04/2018   Procedure: CESAREAN SECTION;  Surgeon: [name], MD;  Location: [facility] BIRTHING SUITES;  Service: Obstetrics;  Laterality: N/A;  . WISDOM TOOTH EXTRACTION      Family History  Problem Relation Age of Onset  . Hypertension Father   . Breast cancer Maternal Aunt   . Breast cancer Maternal Grandmother   . Hypertension Paternal Grandfather   . Hypertension Mother     Social History   Socioeconomic History  . Marital status: Married    Spouse name: Not on file  . Number of children: 1  . Years of education: Not on file  . Highest education level: Not on file  Occupational History  . Not on file  Tobacco Use  . Smoking status: Never Smoker  . Smokeless tobacco: Never Used  Vaping Use  . Vaping Use: Never used  Substance and Sexual Activity  . Alcohol use: Not Currently  . Drug use: Never  . Sexual activity: Not on file  Other Topics Concern  . Not on file  Social History Narrative  . Not on file   Social Determinants of Health   Financial Resource  Strain:   . Difficulty of Paying Living Expenses: Not on file  Food Insecurity:   . Worried About Programme researcher, broadcasting/film/video in the Last Year: Not on file  . Ran Out of Food in the Last Year: Not on file  Transportation Needs:   . Lack of Transportation (Medical): Not on file  . Lack of Transportation (Non-Medical): Not on file  Physical Activity:   . Days of Exercise per Week: Not on file  . Minutes of Exercise per Session: Not on file  Stress:   . Feeling of Stress : Not on file  Social Connections:   . Frequency of Communication with Friends and Family: Not on file   . Frequency of Social Gatherings with Friends and Family: Not on file  . Attends Religious Services: Not on file  . Active Member of Clubs or Organizations: Not on file  . Attends Banker Meetings: Not on file  . Marital Status: Not on file  Intimate Partner Violence:   . Fear of Current or Ex-Partner: Not on file  . Emotionally Abused: Not on file  . Physically Abused: Not on file  . Sexually Abused: Not on file    Review of Systems  Respiratory: Positive for shortness of breath.     Vitals:   03/09/20 1026  BP: 122/80  Pulse: 94  Temp: 98.7 F (37.1 C)  SpO2: 97%     Physical Exam Constitutional:      Appearance: Normal appearance.  HENT:     Nose: No congestion.  Eyes:     General:        Right eye: No discharge.        Left eye: No discharge.  Cardiovascular:     Rate and Rhythm: Normal rate and regular rhythm.     Pulses: Normal pulses.     Heart sounds: Normal heart sounds. No murmur heard.  No friction rub.  Pulmonary:     Effort: Pulmonary effort is normal. No respiratory distress.     Breath sounds: Normal breath sounds. No stridor. No wheezing or rhonchi.  Musculoskeletal:     Cervical back: No rigidity or tenderness.  Neurological:     Mental Status: She is alert.  Psychiatric:        Mood and Affect: Mood normal.   Data Reviewed: Chest x-ray with right lower lobe reticulonodular infiltrate  Assessment:  Abnormal chest x-ray  Shortness of breath  Possibility of respiratory infection  Chest x-ray findings may also be remnant from recent Covid infection however there is no previous x-ray to compare with  Concern for PE is less  Plan/Recommendations: We will get a D-dimer  Ultrasound of the lower extremity to rule out DVT  Course of azithromycin  Chest x-ray may need to be repeated in about a month, however if symptoms are much improved then further follow-up will be post delivery  The likelihood of thromboembolism is low  however she is pregnant so the risk is present   Virl Diamond MD [facility] 03/09/2020, 11:19 AM  CC: [name], MD

## 2020-03-12 ENCOUNTER — Other Ambulatory Visit: Payer: Self-pay

## 2020-03-12 ENCOUNTER — Other Ambulatory Visit: Payer: Self-pay | Admitting: Cardiology

## 2020-03-12 ENCOUNTER — Ambulatory Visit (HOSPITAL_COMMUNITY)
Admission: RE | Admit: 2020-03-12 | Discharge: 2020-03-12 | Disposition: A | Discharge status: Home / Self Care | Payer: Managed Care, Other (non HMO) | Source: Ambulatory Visit | Attending: Pulmonary Disease | Admitting: Pulmonary Disease

## 2020-03-12 DIAGNOSIS — R0602 Shortness of breath: Secondary | ICD-10-CM | POA: Insufficient documentation

## 2020-03-12 DIAGNOSIS — R002 Palpitations: Secondary | ICD-10-CM

## 2020-03-12 DIAGNOSIS — R55 Syncope and collapse: Secondary | ICD-10-CM | POA: Insufficient documentation

## 2020-03-12 NOTE — Telephone Encounter (Signed)
From patient.

## 2020-03-12 NOTE — Telephone Encounter (Signed)
Please arrange for 1 week live telemetry for palpitations and pre-syncope

## 2020-03-12 NOTE — Telephone Encounter (Signed)
done

## 2020-03-13 ENCOUNTER — Ambulatory Visit: Payer: Managed Care, Other (non HMO)

## 2020-03-13 DIAGNOSIS — R002 Palpitations: Secondary | ICD-10-CM

## 2020-03-13 DIAGNOSIS — R55 Syncope and collapse: Secondary | ICD-10-CM

## 2020-03-27 ENCOUNTER — Ambulatory Visit: Payer: Managed Care, Other (non HMO) | Admitting: Cardiology

## 2020-03-28 ENCOUNTER — Ambulatory Visit: Payer: Managed Care, Other (non HMO) | Admitting: Cardiology

## 2020-04-02 ENCOUNTER — Institutional Professional Consult (permissible substitution): Payer: Managed Care, Other (non HMO) | Admitting: Internal Medicine

## 2020-04-04 ENCOUNTER — Ambulatory Visit: Payer: Managed Care, Other (non HMO) | Admitting: Cardiology

## 2020-04-04 ENCOUNTER — Encounter: Payer: Self-pay | Admitting: Cardiology

## 2020-04-04 ENCOUNTER — Other Ambulatory Visit: Payer: Self-pay

## 2020-04-04 VITALS — BP 124/70 | HR 104 | Ht 66.0 in | Wt 215.0 lb

## 2020-04-04 DIAGNOSIS — R55 Syncope and collapse: Secondary | ICD-10-CM

## 2020-04-04 DIAGNOSIS — R002 Palpitations: Secondary | ICD-10-CM

## 2020-04-04 NOTE — Progress Notes (Signed)
Patient referred by Vickie Raider, MD for palpitations  Subjective:   Vickie Maldonado, female    DOB: [birth year], 29 y.o.   MRN: [id number]   Chief Complaint  Patient presents with  . Shortness of Breath    Tachy    . Tachycardia  . Follow-up     HPI  29 y.o. Caucasian female with h/o COVID (11/2019), exertional dyspnea  Patient continues to have exertional dyspnea. She also notices heart rate increases rapidly, and takes time to slow down after minimal exertion. Echocardiogram, monitor, showed no significant abnormality. Chest x-ray findings may also be remnant from recent Covid infection however there is no previous x-ray to compare with. She was also evaluated by pulmonology who also ordered d-Dimer and [place]. D-dimer was mildly elevated at 1.12, probably related to pregnancy. She was given a course of antibiotics with no improvement in symptoms.   Initial consultation HPI 02/2020: Patient is a IT consultant, works at a [employer].  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  For the last 2 weeks, patient has had tachycardia and exertional dyspnea with minimal activity.  She did not have any such symptoms prior to the past 2 weeks, or in her first pregnancy.  Her resting heart rate has been around 80 bpm, until for the past 2 weeks.  She drove to [place] to visit family on 02/18/2020.  On returning home that evening, she felt her heart rate going fast, with Apple Watch registering heart rate of 180 bpm.  She experienced shortness of breath with climbing up a flight of stairs.  Tachycardia and exertional dyspnea having stayed more or less persistent for the past 2 weeks.  She denies any chest pain.  Of note, patient had Covid in September 2021, and received monoclonal antibodies.  Her symptoms at that time were primarily shortness of breath, which then resolved, only to return for the past 2 weeks.  She does not have any  personal or family history of DVT or PE.  She drinks one coffee per day.  She does not drink any other caffeinated drinks or stimulants.  She has had iron deficiency anemia for a long time, and has in fact improved recently with iron supplements.    Current Outpatient Medications on File Prior to Visit  Medication Sig Dispense Refill  . acetaminophen (TYLENOL) 500 MG tablet Take 1,000 mg by mouth every 6 (six) hours as needed for moderate pain or headache.    . albuterol (PROVENTIL HFA;VENTOLIN HFA) 108 (90 Base) MCG/ACT inhaler Inhale 1-2 puffs into the lungs every 6 (six) hours as needed for wheezing or shortness of breath.    Marland Kitchen azithromycin (ZITHROMAX Z-PAK) 250 MG tablet Take 2 tabs today, then 1 tab until gone 6 each 0  . calcium carbonate (TUMS - DOSED IN MG ELEMENTAL CALCIUM) 500 MG chewable tablet Chew 2 tablets by mouth daily as needed for indigestion or heartburn.    . cetirizine (ZYRTEC) 10 MG tablet Take 10 mg by mouth daily.    . famotidine (PEPCID) 20 MG tablet Take 20 mg by mouth 2 (two) times daily.    . Prenatal Vit-Fe Fumarate-FA (PRENATAL MULTIVITAMIN) TABS tablet Take 1 tablet by mouth at bedtime.    . sertraline (ZOLOFT) 100 MG tablet Take 100 mg by mouth daily.     No current facility-administered medications on file prior to visit.    Cardiovascular and other pertinent studies:  Mobile  cardiac telemetry 7 days 03/13/2020 - 03/20/2020: Dominant rhythm: Sinus. HR 50-159 bpm. Avg HR 82 bpm. Rare SVE, VE No atrial fibrillation/atrial flutter/SVT/VT/high grade AV block, sinus pause >3sec noted. 85 patient triggered events, show sinus rhythm/artifact.  No arrhythmia noted to correlate with patient symptoms.  Echocardiogram 03/02/2020:  Left ventricle cavity is normal in size and wall thickness. Normal global  wall motion. Normal LV systolic function with EF 55%. Normal diastolic  filling pattern.  Left atrial cavity is mildly dilated.  Mild (Grade I) mitral  regurgitation.  Mild tricuspid regurgitation.  No evidence of pulmonary hypertension.  EKG 03/01/2020: Sinus tachycardia 119 bpm Nonspecific T wave inversion inferior leads   Recent labs: 01/30/2020: H/H 11.5/34.5. MCV 78.1. Platelets 264  Results for LONISHA, Vickie Maldonado (MRN [id number]) as of 04/04/2020 08:21  Ref. Range 03/09/2020 11:23  D-Dimer, Quant Latest Ref Range: <0.50 mcg/mL FEU 1.12 (H)      Review of Systems  Cardiovascular: Positive for dyspnea on exertion and palpitations. Negative for chest pain, leg swelling and syncope.       Vitals:   04/04/20 0827  BP: 124/70  Pulse: (!) 104  SpO2: 97%     Body mass index is 34.7 kg/m. Filed Weights   04/04/20 0827  Weight: 215 lb (97.5 kg)     Objective:   Physical Exam Vitals and nursing note reviewed.  Constitutional:      General: She is not in acute distress. Neck:     Vascular: No JVD.  Cardiovascular:     Rate and Rhythm: Regular rhythm. Tachycardia present.     Heart sounds: Normal heart sounds. No murmur heard.   Pulmonary:     Effort: Pulmonary effort is normal.     Breath sounds: Normal breath sounds. No wheezing or rales.         Assessment & Recommendations:   29 y.o. Caucasian female with h/o COVID (11/2019), exertional dyspnea  Exertional dyspnea: Echocardiogram and monitor do not show any major abnormalities.  No suspicion for ischemia. Mildly elevated d-Dimer, possibly related to pregnancy. Defer w/u to pulmonology, if VQ scan would be felt warranted.  I suspect her symptoms are multifactorial, including h/o COVID, anemia, deconditioning.  Encouraged slowly increasing physical activity, as tolerated.  I will see her on as needed basis.     [name], MD Pager: [phone] Office: [phone]

## 2020-04-06 ENCOUNTER — Other Ambulatory Visit: Payer: Self-pay | Admitting: Pulmonary Disease

## 2020-04-06 ENCOUNTER — Ambulatory Visit (INDEPENDENT_AMBULATORY_CARE_PROVIDER_SITE_OTHER): Payer: Managed Care, Other (non HMO)

## 2020-04-06 ENCOUNTER — Encounter: Payer: Self-pay | Admitting: Pulmonary Disease

## 2020-04-06 ENCOUNTER — Ambulatory Visit: Payer: Managed Care, Other (non HMO) | Admitting: Pulmonary Disease

## 2020-04-06 ENCOUNTER — Other Ambulatory Visit: Payer: Self-pay

## 2020-04-06 VITALS — BP 112/80 | HR 103 | Temp 97.6°F | Ht 66.0 in | Wt 212.8 lb

## 2020-04-06 DIAGNOSIS — J4540 Moderate persistent asthma, uncomplicated: Secondary | ICD-10-CM | POA: Diagnosis not present

## 2020-04-06 DIAGNOSIS — J45909 Unspecified asthma, uncomplicated: Secondary | ICD-10-CM | POA: Insufficient documentation

## 2020-04-06 DIAGNOSIS — R7989 Other specified abnormal findings of blood chemistry: Secondary | ICD-10-CM | POA: Insufficient documentation

## 2020-04-06 DIAGNOSIS — Z8616 Personal history of COVID-19: Secondary | ICD-10-CM

## 2020-04-06 DIAGNOSIS — R002 Palpitations: Secondary | ICD-10-CM

## 2020-04-06 DIAGNOSIS — R0600 Dyspnea, unspecified: Secondary | ICD-10-CM | POA: Diagnosis not present

## 2020-04-06 DIAGNOSIS — R0609 Other forms of dyspnea: Secondary | ICD-10-CM

## 2020-04-06 NOTE — Assessment & Plan Note (Signed)
History of elevated D-dimer Currently pregnant Has been evaluated by cardiology Has history of palpitations Walk today in office does not show any exertional hypoxemia Lower extremity Doppler negative for DVT  Plan: We will continue to clinically monitor Do not see indication for VQ scan at this time especially without any overt hypoxemia with physical exertion or on evaluation today Patient is aware to present to the emergency room or contact 911 if she has sudden chest pain her oxygen levels are dropping below 88%

## 2020-04-06 NOTE — Progress Notes (Signed)
Spoke with pt and notified of results per Brian Mack, NP. Pt verbalized understanding and denied any questions. 

## 2020-04-06 NOTE — Assessment & Plan Note (Signed)
Plan: Continue rescue inhaler at this time

## 2020-04-06 NOTE — Patient Instructions (Addendum)
You were seen today by Coral Ceo, NP  for:   1. Exertional dyspnea  Continue as needed follow-up with cardiology  Agree with cardiology's recommendations to slowly work on increasing your overall physical activity as you are able  Walk today in office stable without any hypoxemia  2. D-dimer, elevated  Walk today in office stable without any exertional hypoxemia  Believe we can continue to clinically monitor this  I do not believe that you require VQ scan  If you start to develop sudden onset chest pain or oxygen levels below 88% please contact 911  3. Moderate persistent asthma without complication  We will continue to clinically monitor you  Continue Zyrtec  Continue Pepcid  Only use your albuterol as a rescue medication to be used if you can't catch your breath by resting or doing a relaxed purse lip breathing pattern.  - The less you use it, the better it will work when you need it. - Ok to use up to 2 puffs  every 4 hours if you must but call for immediate appointment if use goes up over your usual need - Don't leave home without it !!  (think of it like the spare tire for your car)   4. History of Covid19  We will complete chest x-ray today  May need to consider pulmonary function testing status post delivery or CT imaging  Follow Up:    Return in about 3 months (around 07/05/2020), or if symptoms worsen or fail to improve, for Follow up with Dr. Wynona Neat, Follow up with Dr. Celine Mans.   Notification of test results are managed in the following manner: If there are  any recommendations or changes to the  plan of care discussed in office today,  we will contact you and let you know what they are. If you do not hear from [place], then your results are normal and you can view them through your  MyChart account , or a letter will be sent to you. Thank you again for trusting [place] with your care  - Thank you, [facility]    It is flu season:   >>> Best ways to protect  herself from the flu: Receive the yearly flu vaccine, practice good hand hygiene washing with soap and also using hand sanitizer when available, eat a nutritious meals, get adequate rest, hydrate appropriately       Please contact the office if your symptoms worsen or you have concerns that you are not improving.   Thank you for choosing [facility] for your healthcare, and for allowing [place] to partner with you on your healthcare journey. I am thankful to be able to provide care to you today.   Elisha Headland FNP-C

## 2020-04-06 NOTE — Progress Notes (Signed)
@Patient  ID: Vickie Maldonado, female    DOB: 1990/12/03, 29 y.o.   MRN: [id number]  Chief Complaint  Patient presents with  . Follow-up    SOB/ cough unchanged     Referring provider: Lupita RaiderShaw, Kimberlee, MD  HPI:  29 year old female never smoker followed in our office for history of COVID-19 as well as dyspnea on exertion  PMH: Allergic rhinitis, palpitations, history of elevated D-dimer, history of COVID-19 Smoker/ Smoking History: Never smoker Maintenance: None Pt of: Dr. Val Eagle  04/06/2020  - Visit    29 year old female never smoker followed in our office for dyspnea on exertion history of COVID-19.  Patient was evaluated by Dr. Val Eagle on 03/09/2020 plan of care from that office visit was to obtain a lower extremity Doppler to rule out a DVT, could repeat chest x-ray in about a month she is treated with a course of azithromycin.  Patient had a chest x-ray that was completed November/2021 those results are listed below:  03/01/2020 shows vague reticulonodular opacities in the base of the lungs concerning for multifocal infection  She was also seen by cardiology on 04/04/2020.  Dr. Rosemary HolmsPatwardhan.  For exertional dyspnea.  Recommendations are listed below:  Exertional dyspnea: Echocardiogram and monitor do not show any major abnormalities.  No suspicion for ischemia. Mildly elevated d-Dimer, possibly related to pregnancy. Defer w/u to pulmonology, if VQ scan would be felt warranted.  I suspect her symptoms are multifactorial, including h/o COVID, anemia, deconditioning.  Encouraged slowly increasing physical activity, as tolerated.   Patient presenting to her office today reporting that she feels about the same.  Walk today in office does not show any exertional hypoxemia.  She occasionally has bouts of palpitations.  She continues to have ongoing nasal drainage which is a chronic finding for her she has allergic rhinitis and is currently pregnant.    Questionaires / Pulmonary  Flowsheets:   ACT:  No flowsheet data found.  MMRC: No flowsheet data found.  Epworth:  No flowsheet data found.  Tests:   Mobile cardiac telemetry 7 days 03/13/2020 - 03/20/2020: Dominant rhythm: Sinus. HR 50-159 bpm. Avg HR 82 bpm. Rare SVE, VE No atrial fibrillation/atrial flutter/SVT/VT/high grade AV block, sinus pause >3sec noted. 85 patient triggered events, show sinus rhythm/artifact.  No arrhythmia noted to correlate with patient symptoms.   FENO:  No results found for: NITRICOXIDE  PFT: No flowsheet data found.  WALK:  SIX MIN WALK 04/06/2020  Supplimental Oxygen during Test? (L/min) No  Tech Comments: Pt was able to complete 3 laps without any stops.    Imaging: VAS US LOWER EXTREMITY VENOUS (DVT)  Result Date: 03/12/2020  Lower Venous DVT Study Indications: SOB, and Edema.  Risk Factors: [redacted] weeks pregnant. Comparison Study: none Performing Technologist: Jeb LeveringJill Parker RDMS, RVT  Examination Guidelines: A complete evaluation includes B-mode imaging, spectral Doppler, color Doppler, and power Doppler as needed of all accessible portions of each vessel. Bilateral testing is considered an integral part of a complete examination. Limited examinations for reoccurring indications may be performed as noted. The reflux portion of the exam is performed with the patient in reverse Trendelenburg.  +---------+---------------+---------+-----------+----------+--------------+ RIGHT    CompressibilityPhasicitySpontaneityPropertiesThrombus Aging +---------+---------------+---------+-----------+----------+--------------+ CFV      Full           Yes      Yes                                 +---------+---------------+---------+-----------+----------+--------------+  SFJ      Full                                                        +---------+---------------+---------+-----------+----------+--------------+ FV Prox  Full                                                         +---------+---------------+---------+-----------+----------+--------------+ FV Mid   Full                                                        +---------+---------------+---------+-----------+----------+--------------+ FV DistalFull                                                        +---------+---------------+---------+-----------+----------+--------------+ PFV      Full                                                        +---------+---------------+---------+-----------+----------+--------------+ POP      Full           Yes      Yes                                 +---------+---------------+---------+-----------+----------+--------------+ PTV      Full                                                        +---------+---------------+---------+-----------+----------+--------------+ PERO     Full                                                        +---------+---------------+---------+-----------+----------+--------------+   +---------+---------------+---------+-----------+----------+--------------+ LEFT     CompressibilityPhasicitySpontaneityPropertiesThrombus Aging +---------+---------------+---------+-----------+----------+--------------+ CFV      Full           Yes      Yes                                 +---------+---------------+---------+-----------+----------+--------------+ SFJ      Full                                                        +---------+---------------+---------+-----------+----------+--------------+  FV Prox  Full                                                        +---------+---------------+---------+-----------+----------+--------------+ FV Mid   Full                                                        +---------+---------------+---------+-----------+----------+--------------+ FV DistalFull                                                         +---------+---------------+---------+-----------+----------+--------------+ PFV      Full                                                        +---------+---------------+---------+-----------+----------+--------------+ POP      Full           Yes      Yes                                 +---------+---------------+---------+-----------+----------+--------------+ PTV      Full                                                        +---------+---------------+---------+-----------+----------+--------------+ PERO     Full                                                        +---------+---------------+---------+-----------+----------+--------------+     Summary: BILATERAL: - No evidence of deep vein thrombosis seen in the lower extremities, bilaterally. -No evidence of popliteal cyst, bilaterally.   *See table(s) above for measurements and observations. Electronically signed by Sherald Hess MD on 03/12/2020 at 5:08:51 PM.    Final    LONG TERM MONITOR-LIVE TELEMETRY (3-14 DAYS)  Result Date: 04/04/2020 Mobile cardiac telemetry 7 days 03/13/2020 - 03/20/2020: Dominant rhythm: Sinus. HR 50-159 bpm. Avg HR 82 bpm. Rare SVE, VE No atrial fibrillation/atrial flutter/SVT/VT/high grade AV block, sinus pause >3sec noted. 85 patient triggered events, show sinus rhythm/artifact. No arrhythmia noted to correlate with patient symptoms.   Lab Results:  CBC    Component Value Date/Time   WBC 10.7 (H) 06/22/2019 1713   RBC 5.09 06/22/2019 1713   HGB 12.7 06/22/2019 1713   HCT 40.9 06/22/2019 1713   PLT 368 06/22/2019 1713   MCV 80.4 06/22/2019 1713   MCH 25.0 (L) 06/22/2019 1713   MCHC 31.1 06/22/2019 1713   RDW 14.4  06/22/2019 1713   LYMPHSABS 3.1 06/22/2019 1713   MONOABS 0.7 06/22/2019 1713   EOSABS 0.2 06/22/2019 1713   BASOSABS 0.1 06/22/2019 1713    BMET No results found for: NA, K, CL, CO2, GLUCOSE, BUN, CREATININE, CALCIUM, GFRNONAA, GFRAA  BNP No results found  for: BNP  ProBNP No results found for: PROBNP  Specialty Problems      Pulmonary Problems   Exertional dyspnea   Asthma      Allergies  Allergen Reactions  . Peanut-Containing Drug Products Anaphylaxis  . Oxycodone Nausea And Vomiting  . Latex Hives    Immunization History  Administered Date(s) Administered  . Influenza,inj,quad, With Preservative 04/22/2013  . Meningococcal Polysaccharide 08/28/2006  . Td 09/21/2014  . Tdap 02/26/2006    Past Medical History:  Diagnosis Date  . Anemia   . Headache     Tobacco History: Social History   Tobacco Use  Smoking Status Never Smoker  Smokeless Tobacco Never Used   Counseling given: Not Answered   Continue to not smoke  Outpatient Encounter Medications as of 04/06/2020  Medication Sig  . acetaminophen (TYLENOL) 500 MG tablet Take 1,000 mg by mouth every 6 (six) hours as needed for moderate pain or headache.  . albuterol (PROVENTIL HFA;VENTOLIN HFA) 108 (90 Base) MCG/ACT inhaler Inhale 1-2 puffs into the lungs every 6 (six) hours as needed for wheezing or shortness of breath.  . cetirizine (ZYRTEC) 10 MG tablet Take 10 mg by mouth daily.  . famotidine (PEPCID) 20 MG tablet Take 20 mg by mouth 2 (two) times daily.  . Ferrous Sulfate (IRON) 325 (65 Fe) MG TABS Take 1 tablet by mouth daily.  . Prenatal Vit-Fe Fumarate-FA (PRENATAL MULTIVITAMIN) TABS tablet Take 1 tablet by mouth at bedtime.   No facility-administered encounter medications on file as of 04/06/2020.     Review of Systems  Review of Systems  Constitutional: Positive for fatigue. Negative for activity change and fever.  HENT: Positive for postnasal drip and rhinorrhea. Negative for sinus pressure, sinus pain and sore throat.   Respiratory: Positive for cough (rare - dry ) and shortness of breath. Negative for wheezing.   Cardiovascular: Positive for palpitations (random ). Negative for chest pain.  Gastrointestinal: Negative for diarrhea, nausea and  vomiting.  Musculoskeletal: Negative for arthralgias.  Neurological: Negative for dizziness.  Psychiatric/Behavioral: Negative for sleep disturbance. The patient is not nervous/anxious.      Physical Exam  BP 112/80 (BP Location: Left Arm, Cuff Size: Normal)   Pulse (!) 103   Temp 97.6 F (36.4 C)   Ht 5\' 6"  (1.676 m)   Wt 212 lb 12.8 oz (96.5 kg)   LMP 06/22/2019   SpO2 99%   BMI 34.35 kg/m   Wt Readings from Last 5 Encounters:  04/06/20 212 lb 12.8 oz (96.5 kg)  04/04/20 215 lb (97.5 kg)  03/09/20 203 lb 12.8 oz (92.4 kg)  03/01/20 202 lb (91.6 kg)  06/22/19 185 lb (83.9 kg)    BMI Readings from Last 5 Encounters:  04/06/20 34.35 kg/m  04/04/20 34.70 kg/m  03/09/20 32.89 kg/m  03/01/20 31.64 kg/m  06/22/19 28.98 kg/m     Physical Exam Vitals and nursing note reviewed.  Constitutional:      General: She is not in acute distress.    Appearance: Normal appearance. She is normal weight.     Comments: Pregnant adult female  HENT:     Head: Normocephalic and atraumatic.     Right Ear: Tympanic  membrane, ear canal and external ear normal. There is no impacted cerumen.     Left Ear: Tympanic membrane, ear canal and external ear normal. There is no impacted cerumen.     Nose: Rhinorrhea present. No congestion.     Mouth/Throat:     Mouth: Mucous membranes are moist.     Pharynx: Oropharynx is clear.     Comments: PND Eyes:     Pupils: Pupils are equal, round, and reactive to light.  Cardiovascular:     Rate and Rhythm: Normal rate and regular rhythm.     Pulses: Normal pulses.     Heart sounds: Normal heart sounds. No murmur heard.   Pulmonary:     Effort: Pulmonary effort is normal. No respiratory distress.     Breath sounds: Normal breath sounds. No decreased air movement. No decreased breath sounds, wheezing or rales.  Musculoskeletal:     Cervical back: Normal range of motion.  Skin:    General: Skin is warm and dry.     Capillary Refill: Capillary  refill takes less than 2 seconds.  Neurological:     General: No focal deficit present.     Mental Status: She is alert and oriented to person, place, and time. Mental status is at baseline.     Gait: Gait normal.  Psychiatric:        Mood and Affect: Mood normal.        Behavior: Behavior normal.        Thought Content: Thought content normal.        Judgment: Judgment normal.       Assessment & Plan:   D-dimer, elevated History of elevated D-dimer Currently pregnant Has been evaluated by cardiology Has history of palpitations Walk today in office does not show any exertional hypoxemia Lower extremity Doppler negative for DVT  Plan: We will continue to clinically monitor Do not see indication for VQ scan at this time especially without any overt hypoxemia with physical exertion or on evaluation today Patient is aware to present to the emergency room or contact 911 if she has sudden chest pain her oxygen levels are dropping below 88%  Asthma Plan: Continue rescue inhaler at this time   Palpitations Plan: Continue follow-up with cardiology  Exertional dyspnea Likely multifactorial given recent COVID-19 infection September/2021 as well as ongoing pregnancy.  Patient also has history of asthma.  Plan: Walk today in office stable Chest x-ray today     Return in about 3 months (around 07/05/2020), or if symptoms worsen or fail to improve, for Follow up with Dr. Wynona Neat, Follow up with Dr. Celine Mans.   Coral Ceo, NP 04/06/2020   This appointment required 32 minutes of patient care (this includes precharting, chart review, review of results, face-to-face care, etc.).

## 2020-04-06 NOTE — Assessment & Plan Note (Signed)
Plan: Continue follow-up with cardiology 

## 2020-04-06 NOTE — Assessment & Plan Note (Signed)
Likely multifactorial given recent COVID-19 infection September/2021 as well as ongoing pregnancy.  Patient also has history of asthma.  Plan: Walk today in office stable Chest x-ray today

## 2020-04-09 ENCOUNTER — Telehealth: Payer: Self-pay

## 2020-04-09 NOTE — Telephone Encounter (Signed)
Patient zio monitor report you asked for is available you should be able to see it.

## 2020-04-21 NOTE — L&D Delivery Note (Addendum)
Operative Delivery Note- VBAC At 7:54 PM a viable and healthy female was delivered via Vaginal, [employer], operational).  Presentation: vertex; Position: Right,, Occiput,, Anterior; Station: +4.  Verbal consent: obtained from patient.  Non reassuring FHR noted with persistent deep variable decelerations noted. Risks and benefits discussed in detail.  Risks include, but are not limited to the risks of anesthesia, bleeding, infection, damage to maternal tissues, fetal cephalhematoma.  There is also the risk of inability to effect vaginal delivery of the head, or shoulder dystocia that cannot be resolved by established maneuvers, leading to the need for emergency cesarean section.  APGAR: 7, 8; weight 7 lb 5.8 oz (3340 g).   Placenta status: spontaneous ,intact .   Cord:  with the following complications:  [place]udson x one reduced on perineum  Cord pH: na  Anesthesia:  Epidural and local Instruments: kiwi x 2 pulls Episiotomy: None Lacerations: 3rd degree;Perineal (3c) Suture Repair: 2.0 vicryl rapide and 0 vicryl Est. Blood Loss (mL): 754  Mom to postpartum.  Baby to Couplet care / Skin to Skin.  Drakkar Medeiros J 05/23/2020, 9:11 PM

## 2020-04-22 DIAGNOSIS — R072 Precordial pain: Secondary | ICD-10-CM | POA: Insufficient documentation

## 2020-04-22 NOTE — Progress Notes (Deleted)
Cardiology Office Note   Date:  04/22/2020   ID:  Vickie Maldonado, DOB [birth year], MRN [id number]  PCP:  [name], MD  Cardiologist:   No primary care provider on file. Referring:  ***  No chief complaint on file.     History of Present Illness: Vickie Maldonado is a 30 y.o. female who is referred by *** for evaluation of syncope.  She was seen by another cardiolgoy group.  ***   Echo demonstrated mild MR and TR.   Monitor was unrevealing.  Patient triggered events showed sinus and artifact.  She does have resting sinus tachycardia. She was thought to have symptoms related to anemia and deconditioning and possible Covid that she had in Sept and that required monoclonal antibody treatment.    ***      Past Medical History:  Diagnosis Date  . Anemia   . Headache     Past Surgical History:  Procedure Laterality Date  . CESAREAN SECTION N/A 06/04/2018   Procedure: CESAREAN SECTION;  Surgeon: [name], MD;  Location: [facility] BIRTHING SU[facility];  Service: Obstetrics;  Laterality: N/A;  . WISDOM TOOTH EXTRACTION       Current Outpatient Medications  Medication Sig Dispense Refill  . acetaminophen (TYLENOL) 500 MG tablet Take 1,000 mg by mouth every 6 (six) hours as needed for moderate pain or headache.    . albuterol (PROVENTIL HFA;VENTOLIN HFA) 108 (90 Base) MCG/ACT inhaler Inhale 1-2 puffs into the lungs every 6 (six) hours as needed for wheezing or shortness of breath.    . cetirizine (ZYRTEC) 10 MG tablet Take 10 mg by mouth daily.    . famotidine (PEPCID) 20 MG tablet Take 20 mg by mouth 2 (two) times daily.    . Ferrous Sulfate (IRON) 325 (65 Fe) MG TABS Take 1 tablet by mouth daily.    . Prenatal Vit-Fe Fumarate-FA (PRENATAL MULTIVITAMIN) TABS tablet Take 1 tablet by mouth at bedtime.     No current facility-administered medications for this visit.    Allergies:   Peanut-containing drug products, Oxycodone, and Latex    Social History:  The patient   reports that she has never smoked. She has never used smokeless tobacco. She reports previous alcohol use. She reports that she does not use drugs.   Family History:  The patient's ***family history includes Breast cancer in her maternal aunt and maternal grandmother; Hypertension in her father, mother, and paternal grandfather.    ROS:  Please see the history of present illness.   Otherwise, review of systems are positive for {NONE DEFAULTED:18576::"none"}.   All other systems are reviewed and negative.    PHYSICAL EXAM: VS:  LMP 06/22/2019  , BMI There is no height or weight on file to calculate BMI. GENERAL:  Well appearing HEENT:  Pupils equal round and reactive, fundi not visualized, oral mucosa unremarkable NECK:  No jugular venous distention, waveform within normal limits, carotid upstroke brisk and symmetric, no bruits, no thyromegaly LYMPHATICS:  No cervical, inguinal adenopathy LUNGS:  Clear to auscultation bilaterally BACK:  No CVA tenderness CHEST:  Unremarkable HEART:  PMI not displaced or sustained,S1 and S2 within normal limits, no S3, no S4, no clicks, no rubs, *** murmurs ABD:  Flat, positive bowel sounds normal in frequency in pitch, no bruits, no rebound, no guarding, no midline pulsatile mass, no hepatomegaly, no splenomegaly EXT:  2 plus pulses throughout, no edema, no cyanosis no clubbing SKIN:  No rashes no nodules NEURO:  Cranial nerves II through XII grossly intact, motor grossly intact throughout [facility]:  Cognitively intact, oriented to person place and time    EKG:  EKG {ACTION; IS/IS RUE:45409811} ordered today. The ekg ordered today demonstrates ***   Recent Labs: 06/22/2019: Hemoglobin 12.7; Platelets 368 03/01/2020: TSH 1.210    Lipid Panel No results found for: CHOL, TRIG, HDL, CHOLHDL, VLDL, LDLCALC, LDLDIRECT    Wt Readings from Last 3 Encounters:  04/06/20 212 lb 12.8 oz (96.5 kg)  04/04/20 215 lb (97.5 kg)  03/09/20 203 lb 12.8 oz (92.4 kg)       Other studies Reviewed: Additional studies/ records that were reviewed today include: ***. Review of the above records demonstrates:  Please see elsewhere in the note.  ***   ASSESSMENT AND PLAN:  SYNCOPE:  ***  CHEST PAIN:  ***  PALPITATIONS.  ***    Current medicines are reviewed at length with the patient today.  The patient {ACTIONS; HAS/DOES NOT HAVE:19233} concerns regarding medicines.  The following changes have been made:  {PLAN; NO CHANGE:13088:s}  Labs/ tests ordered today include: *** No orders of the defined types were placed in this encounter.    Disposition:   FU with ***    Signed, Rollene Rotunda, MD  04/22/2020 10:45 AM    [facility]

## 2020-04-24 ENCOUNTER — Ambulatory Visit: Payer: Managed Care, Other (non HMO) | Admitting: Cardiology

## 2020-04-24 DIAGNOSIS — R55 Syncope and collapse: Secondary | ICD-10-CM

## 2020-04-24 DIAGNOSIS — R072 Precordial pain: Secondary | ICD-10-CM

## 2020-04-24 DIAGNOSIS — R002 Palpitations: Secondary | ICD-10-CM

## 2020-05-03 ENCOUNTER — Other Ambulatory Visit: Payer: Self-pay

## 2020-05-03 ENCOUNTER — Inpatient Hospital Stay (HOSPITAL_COMMUNITY)
Admission: AD | Admit: 2020-05-03 | Discharge: 2020-05-03 | Disposition: A | Discharge status: Home / Self Care | Payer: Managed Care, Other (non HMO) | Attending: Obstetrics and Gynecology | Admitting: Obstetrics and Gynecology

## 2020-05-03 ENCOUNTER — Encounter (HOSPITAL_COMMUNITY): Payer: Self-pay | Admitting: Obstetrics and Gynecology

## 2020-05-03 DIAGNOSIS — Z3A35 35 weeks gestation of pregnancy: Secondary | ICD-10-CM | POA: Diagnosis not present

## 2020-05-03 DIAGNOSIS — O4703 False labor before 37 completed weeks of gestation, third trimester: Secondary | ICD-10-CM | POA: Diagnosis not present

## 2020-05-03 LAB — URINALYSIS, ROUTINE W REFLEX MICROSCOPIC
Bilirubin Urine: NEGATIVE
Glucose, UA: NEGATIVE mg/dL
Hgb urine dipstick: NEGATIVE
Ketones, ur: NEGATIVE mg/dL
Nitrite: NEGATIVE
Protein, ur: NEGATIVE mg/dL
Specific Gravity, Urine: 1.009 (ref 1.005–1.030)
pH: 6 (ref 5.0–8.0)

## 2020-05-03 LAB — CBC
HCT: 40.8 % (ref 36.0–46.0)
Hemoglobin: 13 g/dL (ref 12.0–15.0)
MCH: 26.6 pg (ref 26.0–34.0)
MCHC: 31.9 g/dL (ref 30.0–36.0)
MCV: 83.6 fL (ref 80.0–100.0)
Platelets: 205 10*3/uL (ref 150–400)
RBC: 4.88 MIL/uL (ref 3.87–5.11)
RDW: 15.2 % (ref 11.5–15.5)
WBC: 13.1 10*3/uL — ABNORMAL HIGH (ref 4.0–10.5)
nRBC: 0 % (ref 0.0–0.2)

## 2020-05-03 LAB — OB RESULTS CONSOLE GBS: GBS: NEGATIVE

## 2020-05-03 LAB — TYPE AND SCREEN
ABO/RH(D): O POS
Antibody Screen: NEGATIVE

## 2020-05-03 MED ORDER — BETAMETHASONE SOD PHOS & ACET 6 (3-3) MG/ML IJ SUSP
12.0000 mg | Freq: Once | INTRAMUSCULAR | Status: AC
  Administered 2020-05-03: 12 mg via INTRAMUSCULAR
  Filled 2020-05-03: qty 5

## 2020-05-03 MED ORDER — NIFEDIPINE 10 MG PO CAPS
10.0000 mg | ORAL_CAPSULE | ORAL | Status: AC | PRN
  Administered 2020-05-03 (×3): 10 mg via ORAL
  Filled 2020-05-03 (×3): qty 1

## 2020-05-03 MED ORDER — LACTATED RINGERS IV BOLUS
1000.0000 mL | Freq: Once | INTRAVENOUS | Status: AC
  Administered 2020-05-03: 1000 mL via INTRAVENOUS

## 2020-05-03 NOTE — MAU Note (Signed)
. .  Vickie Maldonado is a 30 y.o. at [redacted]w[redacted]d here in MAU reporting: ctx that started at 0400 this morning. She states that she went to the office and she was 1cm, [facility] sent her over for evaluation. No VB or LOF. Endorses good fetal movement. No recent intercourse.   Pain score: 3 Vitals:   05/03/20 1102  BP: 134/82  Pulse: 88  Resp: 15  Temp: 98.1 F (36.7 C)  SpO2: 99%     FHT:145 Lab orders placed from triage:  UA

## 2020-05-03 NOTE — MAU Provider Note (Addendum)
Patient Vickie Maldonado is a 30 y.o. G2P1001  at [redacted]w[redacted]d here with complaints of contractions that started at 4:15 am. She rates that pain a 2/10. The contractions started this morning, they got stronger between 6 and 8 and then have not gotten stronger. She denies decreased fetal movement, vaginal bleeding, leaking of fluid. She reports some extra discharge.   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  History     CSN: [id number]  Arrival date and time: 05/03/20 1047   Event Date/Time   First Provider Initiated Contact with Patient 05/03/20 1119      Chief Complaint  Patient presents with  . Contractions   Abdominal Pain This is a new problem. The problem occurs intermittently. The pain is at a severity of 2/10. Pain radiation: radiate to her back. Associated symptoms include constipation. Pertinent negatives include no diarrhea, dysuria, fever, nausea or vomiting.   Dr. Amado Nash checked her cervix at 10:30; said she that was 1 cm.  OB History    Gravida  2   Para  1   Term  1   Preterm      AB      Living  1     SAB      IAB      Ectopic      Multiple  0   Live Births  1           Past Medical History:  Diagnosis Date  . Anemia   . Headache     Past Surgical History:  Procedure Laterality Date  . CESAREAN SECTION N/A 06/04/2018   Procedure: CESAREAN SECTION;  Surgeon: [name], MD;  Location: [facility] BIRTHING SUITES;  Service: Obstetrics;  Laterality: N/A;  . WISDOM TOOTH EXTRACTION      Family History  Problem Relation Age of Onset  . Hypertension Father   . Breast cancer Maternal Aunt   . Breast cancer Maternal Grandmother   . Hypertension Paternal Grandfather   . Hypertension Mother     Social History   Tobacco Use  . Smoking status: Never Smoker  . Smokeless tobacco: Never Used  Vaping Use  . Vaping Use: Never used  Substance Use Topics  .  Alcohol use: Not Currently  . Drug use: Never    Allergies:  Allergies  Allergen Reactions  . Peanut-Containing Drug Products Anaphylaxis  . Oxycodone Nausea And Vomiting  . Latex Hives    Medications Prior to Admission  Medication Sig Dispense Refill Last Dose  . acetaminophen (TYLENOL) 500 MG tablet Take 1,000 mg by mouth every 6 (six) hours as needed for moderate pain or headache.     . albuterol (PROVENTIL HFA;VENTOLIN HFA) 108 (90 Base) MCG/ACT inhaler Inhale 1-2 puffs into the lungs every 6 (six) hours as needed for wheezing or shortness of breath.     . cetirizine (ZYRTEC) 10 MG tablet Take 10 mg by mouth daily.     . famotidine (PEPCID) 20 MG tablet Take 20 mg by mouth 2 (two) times daily.     . Ferrous Sulfate (IRON) 325 (65 Fe) MG TABS Take 1 tablet by mouth daily.     . Prenatal Vit-Fe Fumarate-FA (PRENATAL MULTIVITAMIN) TABS tablet Take 1 tablet by mouth at bedtime.       Review of Systems  Constitutional: Negative for fever.  Respiratory: Negative.   Gastrointestinal: Positive for abdominal pain  and constipation. Negative for diarrhea, nausea and vomiting.  Genitourinary: Negative for dysuria and vaginal bleeding.  Neurological: Negative.    Physical Exam   Blood pressure 130/87, pulse 88, temperature 98.1 F (36.7 C), temperature source Oral, resp. rate 15, last menstrual period 06/22/2019, SpO2 99 %, unknown if currently breastfeeding.  Physical Exam Constitutional:      Appearance: Normal appearance.  HENT:     Head: Normocephalic.  Cardiovascular:     Rate and Rhythm: Normal rate.  Pulmonary:     Effort: Pulmonary effort is normal.  Abdominal:     General: Abdomen is flat. There is no distension.     Palpations: Abdomen is soft.     Tenderness: There is no abdominal tenderness.  Neurological:     Mental Status: She is alert.     MAU Course  Procedures  MDM NST;135 bpm, mod var, present acel, no decels, contractions originally q3-4 now q 5 or  greater min apart.   Patient had three doses of procardia, reports that her contractions spaced out but that she feels "groggy" from the medicine. Will give another IV fluid bolus. Patient feels like her contractions improved with the procardia and got stronger after procardia wore off.  HR is consistently above 90, I do not want to give terbutaline . 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   UA shows no sign of dehydration; patient feels that she has eaten and hydrated but reporting a HA. She believes  HA is due to the procardia. She denies any other symptoms at this time.    Patient received BMZ in MAU today.  Assessment and Plan   1. Preterm labor in third trimester without delivery    -case discussed with Dr. Amado Nash and Dr. Billy Coast, Dr. Billy Coast recommends that patient call [facility] for appt early next week.   2. Patient to call [facility] for follow-up appt next week and also for BMZ shot at [employer].   3. Strict return precautions given, including bleeding, LOF, decreased fetal movements, contractions.  4. All questions answered; patient safe for discharge.   Charlesetta Garibaldi Brandell Maready 05/03/2020, 11:24 AM

## 2020-05-03 NOTE — Discharge Instructions (Signed)
-  call [facility] for follow up appt next week -call [facility] for BMZ injection tomorrow afternoon -strict return precautions given

## 2020-05-04 ENCOUNTER — Other Ambulatory Visit: Payer: Self-pay

## 2020-05-04 ENCOUNTER — Encounter (HOSPITAL_COMMUNITY): Payer: Self-pay | Admitting: Obstetrics

## 2020-05-04 ENCOUNTER — Inpatient Hospital Stay (HOSPITAL_COMMUNITY)
Admission: AD | Admit: 2020-05-04 | Discharge: 2020-05-04 | Disposition: A | Discharge status: Home / Self Care | Payer: Managed Care, Other (non HMO) | Source: Home / Self Care | Attending: Obstetrics & Gynecology | Admitting: Obstetrics & Gynecology

## 2020-05-04 ENCOUNTER — Inpatient Hospital Stay (HOSPITAL_COMMUNITY)
Admission: AD | Admit: 2020-05-04 | Discharge: 2020-05-04 | Disposition: A | Discharge status: Home / Self Care | Payer: Managed Care, Other (non HMO) | Attending: Obstetrics | Admitting: Obstetrics

## 2020-05-04 DIAGNOSIS — Z7689 Persons encountering health services in other specified circumstances: Secondary | ICD-10-CM

## 2020-05-04 DIAGNOSIS — O4703 False labor before 37 completed weeks of gestation, third trimester: Secondary | ICD-10-CM

## 2020-05-04 DIAGNOSIS — O26891 Other specified pregnancy related conditions, first trimester: Secondary | ICD-10-CM | POA: Insufficient documentation

## 2020-05-04 DIAGNOSIS — Z8616 Personal history of COVID-19: Secondary | ICD-10-CM | POA: Diagnosis not present

## 2020-05-04 DIAGNOSIS — Z3A35 35 weeks gestation of pregnancy: Secondary | ICD-10-CM

## 2020-05-04 HISTORY — DX: Dyspnea, unspecified: R06.00

## 2020-05-04 HISTORY — DX: Tachycardia, unspecified: R00.0

## 2020-05-04 LAB — RPR: RPR Ser Ql: NONREACTIVE

## 2020-05-04 MED ORDER — BETAMETHASONE SOD PHOS & ACET 6 (3-3) MG/ML IJ SUSP
12.0000 mg | Freq: Once | INTRAMUSCULAR | Status: AC
  Administered 2020-05-04: 12 mg via INTRAMUSCULAR
  Filled 2020-05-04: qty 5

## 2020-05-04 NOTE — MAU Provider Note (Addendum)
Chief Complaint:  Contractions   Event Date/Time   First Provider Initiated Contact with Patient 05/04/20 0423      HPI: Vickie Maldonado is a 30 y.o. G2P1001 at [redacted]w[redacted]d who presents to maternity admissions reporting contractions that are getting stronger and closer together. She presented 05/04/19 for contractions and was discharged with cervix 1/thick without cervical dilation over time.  There are no associated symptoms. She has not tried any treatments. She desires a natural labor as much as possible. She reports good fetal movement.    HPI  Past Medical History: Past Medical History:  Diagnosis Date  . Anemia   . 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 Headache   . Sinus tachycardia     Past obstetric history: OB History  Gravida Para Term Preterm AB Living  2 1 1     1   SAB IAB Ectopic Multiple Live Births        0 1    # Outcome Date GA Lbr Len/2nd Weight Sex Delivery Anes PTL Lv  2 Current           1 Term 06/05/18 [redacted]w[redacted]d  3450 g F CS-LTranv Spinal  LIV    Past Surgical History: Past Surgical History:  Procedure Laterality Date  . CESAREAN SECTION N/A 06/04/2018   Procedure: CESAREAN SECTION;  Surgeon: 06/06/2018, MD;  Location: [facility] BIRTHING SUITES;  Service: Obstetrics;  Laterality: N/A;  . WISDOM TOOTH EXTRACTION      Family History: Family History  Problem Relation Age of Onset  . Hypertension Father   . Breast cancer Maternal Aunt   . Breast cancer Maternal Grandmother   . Hypertension Paternal Grandfather   . Hypertension Mother     Social History: Social History   Tobacco Use  . Smoking status: Never Smoker  . Smokeless tobacco: Never Used  Vaping Use  . Vaping Use: Never used  Substance Use Topics  . Alcohol use: Not Currently  . Drug use: Never    Allergies:  Allergies  Allergen Reactions  . Peanut-Containing Drug Products Anaphylaxis  . Oxycodone Nausea And Vomiting  . Latex Hives    Meds:  Medications Prior to Admission   Medication Sig Dispense Refill Last Dose  . acetaminophen (TYLENOL) 500 MG tablet Take 1,000 mg by mouth every 6 (six) hours as needed for moderate pain or headache.   05/03/2020 at Unknown time  . albuterol (PROVENTIL HFA;VENTOLIN HFA) 108 (90 Base) MCG/ACT inhaler Inhale 1-2 puffs into the lungs every 6 (six) hours as needed for wheezing or shortness of breath.   Past Month at Unknown time  . cetirizine (ZYRTEC) 10 MG tablet Take 10 mg by mouth daily.   05/03/2020 at Unknown time  . famotidine (PEPCID) 20 MG tablet Take 20 mg by mouth 2 (two) times daily.   05/03/2020 at Unknown time  . Ferrous Sulfate (IRON) 325 (65 Fe) MG TABS Take 1 tablet by mouth daily.   05/03/2020 at Unknown time  . Prenatal Vit-Fe Fumarate-FA (PRENATAL MULTIVITAMIN) TABS tablet Take 1 tablet by mouth at bedtime.   05/03/2020 at Unknown time    ROS:  Review of Systems  Constitutional: Negative for chills, fatigue and fever.  HENT: Negative for sinus pressure.   Eyes: Negative for photophobia.  Respiratory: Negative for shortness of breath.   Cardiovascular: Negative for chest pain.  Gastrointestinal: Positive for abdominal pain. Negative for constipation, diarrhea, nausea and vomiting.  Genitourinary: Negative for difficulty urinating, dysuria,  flank pain, frequency, pelvic pain, vaginal bleeding, vaginal discharge and vaginal pain.  Musculoskeletal: Negative for neck pain.  Neurological: Negative for dizziness, weakness and headaches.  Psychiatric/Behavioral: Negative.      I have reviewed patient's Past Medical Hx, Surgical Hx, Family Hx, Social Hx, medications and allergies.   Physical Exam   Patient Vitals for the past 24 hrs:  BP Temp Temp src Pulse Resp  05/04/20 0408 -- 98.6 F (37 C) Oral -- 17  05/04/20 0354 139/83 -- -- 89 --   Constitutional: Well-developed, well-nourished female in no acute distress.  Cardiovascular: normal rate Respiratory: normal effort GI: Abd soft, non-tender, gravid  appropriate for gestational age.  MS: Extremities nontender, no edema, normal ROM Neurologic: Alert and oriented x 4.  GU: Neg CVAT.   Dilation: 1 Effacement (%): 50 Cervical Position: Posterior Station: -3 Exam by:: Minerva Fester CNM  FHT:  Baseline 135 , moderate variability, accelerations present, no decelerations Contractions: q 2 mins, moderate to palpation   Cervix 1.5/50/-3 on recheck in 2 hours  Labs: Results for orders placed or performed during the hospital encounter of 05/03/20 (from the past 24 hour(s))  Urinalysis, Routine w reflex microscopic Urine, Clean Catch     Status: Abnormal   Collection Time: 05/03/20 11:16 AM  Result Value Ref Range   Color, Urine YELLOW YELLOW   APPearance HAZY (A) CLEAR   Specific Gravity, Urine 1.009 1.005 - 1.030   pH 6.0 5.0 - 8.0   Glucose, UA NEGATIVE NEGATIVE mg/dL   Hgb urine dipstick NEGATIVE NEGATIVE   Bilirubin Urine NEGATIVE NEGATIVE   Ketones, ur NEGATIVE NEGATIVE mg/dL   Protein, ur NEGATIVE NEGATIVE mg/dL   Nitrite NEGATIVE NEGATIVE   Leukocytes,Ua TRACE (A) NEGATIVE   RBC / HPF 0-5 0 - 5 RBC/hpf   WBC, UA 0-5 0 - 5 WBC/hpf   Bacteria, UA RARE (A) NONE SEEN   Squamous Epithelial / LPF 6-10 0 - 5   Mucus PRESENT   CBC     Status: Abnormal   Collection Time: 05/03/20 11:35 AM  Result Value Ref Range   WBC 13.1 (H) 4.0 - 10.5 K/uL   RBC 4.88 3.87 - 5.11 MIL/uL   Hemoglobin 13.0 12.0 - 15.0 g/dL   HCT 80.9 98.3 - 38.2 %   MCV 83.6 80.0 - 100.0 fL   MCH 26.6 26.0 - 34.0 pg   MCHC 31.9 30.0 - 36.0 g/dL   RDW 50.5 39.7 - 67.3 %   Platelets 205 150 - 400 K/uL   nRBC 0.0 0.0 - 0.2 %  Type and screen [facility]     Status: None   Collection Time: 05/03/20 11:35 AM  Result Value Ref Range   ABO/RH(D) O POS    Antibody Screen NEG    Sample Expiration      05/06/2020,2359 Performed at [facility], [address], [place], [place] [address]    --/--/O POS (01/13 1135)  Imaging:  DG Chest 1  View  Result Date: 04/06/2020 CLINICAL DATA:  COVID pneumonia. EXAM: CHEST  1 VIEW COMPARISON:  03/01/2020 FINDINGS: The cardiac silhouette, mediastinal and hilar contours are within normal limits. The lungs are clear of an acute process. No pulmonary infiltrates or pleural effusion. Stable increased markings at the lung bases likely due to overlying breast tissue. The bony thorax is intact. IMPRESSION: No acute cardiopulmonary findings. Electronically Signed   By: Rudie Meyer M.D.   On: 04/06/2020 15:25  MAU Course/MDM: No orders of the defined types were placed in this encounter.   No orders of the defined types were placed in this encounter.    NST reviewed and reactive x 3 hours Pt with small cervical change in 2 hours with report of contractions getting stronger and closer together, toco with contractions ever 2-3 minutes, so plan to recheck 2 additional hours Report to Luna Kitchens, CNM   Sharen Counter Certified Nurse-Midwife 05/04/2020 4:50 AM   Patient rechecked at 845; cervix feels internal os is 1 cm, external os is 2 cm. Patient nervous about going home. Explained in detail when to return, offered to recheck patient this afternoon when she returns for her BMZ. Patient will be rechecked this afternoon around 3 pm.   All questions answered, patient will call [facility] and set up appointment next week with Dr. Billy Coast.   Luna Kitchens

## 2020-05-04 NOTE — MAU Note (Signed)
Pt here for 2nd dose of betamethasone.  K.Kooistra MSE pt and pt desired her cervix to be checked again. Still feeling increased pelvic pressure.

## 2020-05-04 NOTE — Discharge Instructions (Signed)
Return to MAU at 3:30 or later for second shot of BMZ and cervical exam Return to MAU if any bleeding or your belly feels constantly tight and does not relax between contractions

## 2020-05-04 NOTE — MAU Provider Note (Signed)
Patient Vickie Maldonado is a 30 y.o. G2P1001 at [redacted]w[redacted]d here for follow-up cervical check and 2nd dose of steroids. She denies vaginal bleeding, decreased fetal movements, loss of fluid. She reports that her contractions have lessened during the day but that they have picked up since she came back to hospital, although they are manageable.   Cervix is unchanged: external os feels 2 cm but internal os is 1 cm; NST not done as patient reports no increase in contractions or worsening pain.   Patient tolerated BMZ injection  Patient will follow up with Dr. Billy Coast next Wednesday.    Assessment and Plan   1. Encounter for medication administration    2. Patient stable for discharge; she will keep appt with Dr. Billy Coast next week. 3. Return precautions reviewed; patient and husband verbalized warning signs and when to return to MAU.  Charlesetta Garibaldi Weber Monnier 05/04/2020, 5:15 PM

## 2020-05-04 NOTE — MAU Note (Signed)
Pt reports contractions that are getting stronger and closer

## 2020-05-12 NOTE — Progress Notes (Deleted)
Cardiology Office Note   Date:  05/12/2020   ID:  Vickie, Maldonado December 04, 1990, MRN [id number]  PCP:  [name], MD  Cardiologist:   No primary care provider on file. Referring:  ***  No chief complaint on file.     History of Present Illness: Vickie Maldonado is a 30 y.o. female who presents for ***    She was previously seen by another cardiology group in town.  She was seen in Nov during her second trimester of pregnancy.  She had palpitations.  Apple Watch had demonstrated a rate of 180.  She had an echo with NL EF and mild MR and mild LAE.  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  D dimer was elevated.  Chest Xray was clear.   She had no DVT noted.    ***   Past Medical History:  Diagnosis Date  . Anemia   . 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 Headache   . Sinus tachycardia     Past Surgical History:  Procedure Laterality Date  . CESAREAN SECTION N/A 06/04/2018   Procedure: CESAREAN SECTION;  Surgeon: [name], MD;  Location: [facility] BIRTHING SUITES;  Service: Obstetrics;  Laterality: N/A;  . WISDOM TOOTH EXTRACTION       Current Outpatient Medications  Medication Sig Dispense Refill  . acetaminophen (TYLENOL) 500 MG tablet Take 1,000 mg by mouth every 6 (six) hours as needed for moderate pain or headache.    . albuterol (PROVENTIL HFA;VENTOLIN HFA) 108 (90 Base) MCG/ACT inhaler Inhale 1-2 puffs into the lungs every 6 (six) hours as needed for wheezing or shortness of breath.    . cetirizine (ZYRTEC) 10 MG tablet Take 10 mg by mouth daily.    . famotidine (PEPCID) 20 MG tablet Take 20 mg by mouth 2 (two) times daily.    . Ferrous Sulfate (IRON) 325 (65 Fe) MG TABS Take 1 tablet by mouth daily.    . Prenatal Vit-Fe Fumarate-FA (PRENATAL MULTIVITAMIN) TABS tablet Take 1 tablet by mouth at bedtime.     No current facility-administered medications for this visit.    Allergies:   Peanut-containing  drug products, Oxycodone, and Latex    Social History:  The patient  reports that she has never smoked. She has never used smokeless tobacco. She reports previous alcohol use. She reports that she does not use drugs.   Family History:  The patient's ***family history includes Breast cancer in her maternal aunt and maternal grandmother; Hypertension in her father, mother, and paternal grandfather.    ROS:  Please see the history of present illness.   Otherwise, review of systems are positive for {NONE DEFAULTED:18576::"none"}.   All other systems are reviewed and negative.    PHYSICAL EXAM: VS:  LMP 06/22/2019  , BMI There is no height or weight on file to calculate BMI. GENERAL:  Well appearing HEENT:  Pupils equal round and reactive, fundi not visualized, oral mucosa unremarkable NECK:  No jugular venous distention, waveform within normal limits, carotid upstroke brisk and symmetric, no bruits, no thyromegaly LYMPHATICS:  No cervical, inguinal adenopathy LUNGS:  Clear to auscultation bilaterally BACK:  No CVA tenderness CHEST:  Unremarkable HEART:  PMI not displaced or sustained,S1 and S2 within normal limits, no S3, no S4, no clicks, no rubs, *** murmurs ABD:  Flat, positive bowel sounds normal in frequency in pitch, no  bruits, no rebound, no guarding, no midline pulsatile mass, no hepatomegaly, no splenomegaly EXT:  2 plus pulses throughout, no edema, no cyanosis no clubbing SKIN:  No rashes no nodules NEURO:  Cranial nerves II through XII grossly intact, motor grossly intact throughout PSYCH:  Cognitively intact, oriented to person place and time    EKG:  EKG {ACTION; IS/IS QIO:96295284} ordered today. The ekg ordered today demonstrates ***   Recent Labs: 03/01/2020: TSH 1.210 05/03/2020: Hemoglobin 13.0; Platelets 205    Lipid Panel No results found for: CHOL, TRIG, HDL, CHOLHDL, VLDL, LDLCALC, LDLDIRECT    Wt Readings from Last 3 Encounters:  04/06/20 212 lb 12.8 oz  (96.5 kg)  04/04/20 215 lb (97.5 kg)  03/09/20 203 lb 12.8 oz (92.4 kg)      Other studies Reviewed: Additional studies/ records that were reviewed today include: ***. Review of the above records demonstrates:  Please see elsewhere in the note.  ***   ASSESSMENT AND PLAN:  TACHYCARDIA:  ***   Current medicines are reviewed at length with the patient today.  The patient {ACTIONS; HAS/DOES NOT HAVE:19233} concerns regarding medicines.  The following changes have been made:  {PLAN; NO CHANGE:13088:s}  Labs/ tests ordered today include: *** No orders of the defined types were placed in this encounter.    Disposition:   FU with ***    Signed, Rollene Rotunda, MD  05/12/2020 12:46 PM    [facility]

## 2020-05-14 ENCOUNTER — Ambulatory Visit: Payer: Managed Care, Other (non HMO) | Admitting: Cardiology

## 2020-05-14 DIAGNOSIS — I495 Sick sinus syndrome: Secondary | ICD-10-CM

## 2020-05-20 ENCOUNTER — Other Ambulatory Visit: Payer: Self-pay

## 2020-05-20 ENCOUNTER — Encounter (HOSPITAL_COMMUNITY): Payer: Self-pay

## 2020-05-20 ENCOUNTER — Inpatient Hospital Stay (EMERGENCY_DEPARTMENT_HOSPITAL)
Admission: AD | Admit: 2020-05-20 | Discharge: 2020-05-20 | Disposition: A | Discharge status: Home / Self Care | Payer: Managed Care, Other (non HMO) | Source: Home / Self Care | Attending: Obstetrics and Gynecology | Admitting: Obstetrics and Gynecology

## 2020-05-20 DIAGNOSIS — O4703 False labor before 37 completed weeks of gestation, third trimester: Secondary | ICD-10-CM

## 2020-05-20 DIAGNOSIS — O479 False labor, unspecified: Secondary | ICD-10-CM

## 2020-05-20 DIAGNOSIS — Z3A37 37 weeks gestation of pregnancy: Secondary | ICD-10-CM | POA: Insufficient documentation

## 2020-05-20 DIAGNOSIS — O471 False labor at or after 37 completed weeks of gestation: Secondary | ICD-10-CM | POA: Insufficient documentation

## 2020-05-20 NOTE — Discharge Instructions (Signed)
First Stage of Labor Labor is your body's natural process of moving your baby and other structures, including the placenta and umbilical cord, out of your uterus. There are three stages of labor. How long each stage lasts is different for every woman. But certain events happen during each stage that are the same for everyone.  The first stage starts when true labor begins. This stage ends when your cervix, which is the opening from your uterus into your vagina, is completely open (dilated).  The second stage begins when your cervix is fully dilated and you start pushing. This stage ends when your baby is born.  The third stage is the delivery of the organ that nourished your baby during pregnancy (placenta). First stage of labor As your due date gets closer, you may start to notice certain physical changes that mean labor is going to start soon. You may feel that your baby has dropped lower into your pelvis. You may experience irregular, often painless, contractions that go away when you walk around or lie down (Braxton Hicks contractions). This is also called false labor. The first stage of labor begins when you start having contractions that come at regular (evenly spaced) intervals and your cervix starts to get thinner and wider in preparation for your baby to pass through. Birth care providers measure the dilation of your cervix in centimeters (cm). One centimeter is a little less than one-half of an inch. The first stage ends when your cervix is dilated to 10 cm. The first stage of labor is divided into three phases:  Early phase.  Active phase.  Transitional phase. The length of the first stage of labor varies. It may be longer if this is your first pregnancy. You may spend most of this stage at home trying to relax and stay comfortable. How does this affect me? During the first stage of labor, you will move through three phases. What happens in the early phase?  You will start to have  regular contractions that last 30-60 seconds. Contractions may come every 5-20 minutes. Keep track of your contractions and call your birth care provider.  Your water may break during this phase.  You may notice a clear or slightly bloody discharge of mucus (mucus plug) from your vagina.  Your cervix will dilate to 3-6 cm. What happens in the active phase? The active phase usually lasts 3-5 hours. You may go to the hospital or birth center around this time. During the active phase:  Your contractions will become stronger, longer, and more uncomfortable.  Your contractions may last 45-90 seconds and come every 3-5 minutes.  You may feel lower back pain.  Your birth care providers may examine your cervix and feel your belly to find the position of your baby.  You may have a monitor strapped to your belly to measure your contractions and your baby's heart rate.  You may start using your pain management options.  Your cervix may be dilated to 6 cm and may start to dilate more quickly. What happens in the transitional phase? The transitional phase typically lasts from 30 minutes to 2 hours. At the end of this phase, your cervix will be fully dilated to 10 cm. During the transitional phase:  Contractions will get stronger and longer.  Contractions may last 60-90 seconds and come less than 2 minutes apart.  You may feel hot flashes, chills, or nausea. How does this affect my baby? During the first stage of labor, your baby will   gradually move down into your birth canal. Follow these instructions at home and in the hospital or birth center:  When labor first begins, try to stay calm. You are still in the early phase. If it is night, try to get some sleep. If it is day, try to relax and save your energy. You may want to make some calls and get ready to go to the hospital or birth center.  When you are in the early phase, try these methods to help ease discomfort: ? Deep breathing and  muscle relaxation. ? Taking a walk. ? Taking a warm bath or shower.  Drink some fluids and have a light snack if you feel like it.  Keep track of your contractions.  Based on the plan you created with your birth care provider, call when your contractions indicate it is time.  If your water breaks, note the time, color, and odor of the fluid.  When you are in the active phase, do your breathing exercises and rely on your support people and your team of birth care providers.   Contact a health care provider if:  Your contractions are strong and regular.  You have lower back pain or cramping.  Your water breaks.  You lose your mucus plug. Get help right away if you:  Have a severe headache that does not go away.  Have changes in your vision.  Have severe pain in your upper belly.  Do not feel the baby move.  Have bright red bleeding. Summary  The first stage of labor starts when true labor begins, and it ends when your cervix is dilated to 10 cm.  The first stage of labor has three phases: early, active, and transitional.  Your baby moves into the birth canal during the first stage of labor.  You may have contractions that become stronger and longer. You may also lose your mucus plug and have your water break.  Call your birth care provider when your contractions are frequent and strong enough to go to the hospital or birth center. This information is not intended to replace advice given to you by your health care provider. Make sure you discuss any questions you have with your health care provider. Document Revised: 07/29/2018 Document Reviewed: 06/21/2017 Elsevier Patient Education  2021 [employer]  

## 2020-05-20 NOTE — MAU Provider Note (Signed)
Event Date/Time  First Provider Initiated Contact with Patient 05/20/20 2146    S: Ms. DIANIA CO is a 30 y.o. G2P1001 at [redacted]w[redacted]d  who presents to MAU today complaining of painful contractions since early this evening. She is unsure of duration or interval. She denies vaginal bleeding. She denies LOF. She reports normal fetal movement.  She receives prenatal care with [facility]  O: BP 120/76 (BP Location: Right Arm)   Pulse (!) 103   Temp 98.7 F (37.1 C) (Oral)   Resp 19   Wt 100.7 kg   LMP 06/22/2019   SpO2 100%   BMI 35.84 kg/m  GENERAL: Well-developed, well-nourished female in no acute distress.  HEAD: Normocephalic, atraumatic.  CHEST: Normal effort of breathing, regular heart rate ABDOMEN: Soft, nontender, gravid  Cervical exam:  Dilation: 2 Effacement (%): 50 Cervical Position: Posterior Station: Ballotable Presentation: Vertex Exam by:: Erle Crocker, RN  Fetal Monitoring: Baseline: 125 Variability: Mod Accelerations: 15 x 15 Decelerations: None Contractions: Occasional, spacing out significantly during eval in MAU  A: [facility]UP at [redacted]w[redacted]d  For TOLAC Negligible cervical change during initial 90 minutes of observation Patient accepted offer of one hour ambulation, changing positions in room  No change 90 minutes later Comfort medication declined by patient CNM at bedside to discuss stages of labor, signs of increasing acuity  P: Discharge home in stable condition  F/U: Patient has next prenatal appointment Tuesday 05/22/2020  Clayton Bibles, CNM 05/20/2020 11:56 PM

## 2020-05-20 NOTE — MAU Note (Signed)
Patient reports CTX and constant lower abdominal pain that started today.  Uncertain how far apart the CTX are occurring.  Denies LOF/VB.  Endorses + FM.  TOLAC.

## 2020-05-22 ENCOUNTER — Other Ambulatory Visit: Payer: Self-pay | Admitting: Obstetrics and Gynecology

## 2020-05-23 ENCOUNTER — Encounter (HOSPITAL_COMMUNITY): Payer: Self-pay | Admitting: Obstetrics and Gynecology

## 2020-05-23 ENCOUNTER — Inpatient Hospital Stay (HOSPITAL_COMMUNITY): Payer: Managed Care, Other (non HMO) | Admitting: Anesthesiology

## 2020-05-23 ENCOUNTER — Inpatient Hospital Stay (HOSPITAL_COMMUNITY): Payer: Managed Care, Other (non HMO)

## 2020-05-23 ENCOUNTER — Other Ambulatory Visit: Payer: Self-pay

## 2020-05-23 ENCOUNTER — Inpatient Hospital Stay (HOSPITAL_COMMUNITY)
Admission: AD | Admit: 2020-05-23 | Discharge: 2020-05-25 | DRG: 768 | Disposition: A | Discharge status: Home / Self Care | Payer: Managed Care, Other (non HMO) | Attending: Obstetrics and Gynecology | Admitting: Obstetrics and Gynecology

## 2020-05-23 DIAGNOSIS — D62 Acute posthemorrhagic anemia: Secondary | ICD-10-CM | POA: Diagnosis not present

## 2020-05-23 DIAGNOSIS — O2243 Hemorrhoids in pregnancy, third trimester: Secondary | ICD-10-CM | POA: Diagnosis present

## 2020-05-23 DIAGNOSIS — O34219 Maternal care for unspecified type scar from previous cesarean delivery: Secondary | ICD-10-CM | POA: Diagnosis present

## 2020-05-23 DIAGNOSIS — Z8616 Personal history of COVID-19: Secondary | ICD-10-CM | POA: Diagnosis not present

## 2020-05-23 DIAGNOSIS — Z3A37 37 weeks gestation of pregnancy: Secondary | ICD-10-CM | POA: Diagnosis not present

## 2020-05-23 DIAGNOSIS — Z20822 Contact with and (suspected) exposure to covid-19: Secondary | ICD-10-CM | POA: Diagnosis present

## 2020-05-23 DIAGNOSIS — O9081 Anemia of the puerperium: Secondary | ICD-10-CM | POA: Diagnosis not present

## 2020-05-23 DIAGNOSIS — K644 Residual hemorrhoidal skin tags: Secondary | ICD-10-CM | POA: Diagnosis present

## 2020-05-23 DIAGNOSIS — Z3490 Encounter for supervision of normal pregnancy, unspecified, unspecified trimester: Secondary | ICD-10-CM | POA: Diagnosis present

## 2020-05-23 DIAGNOSIS — O403XX0 Polyhydramnios, third trimester, not applicable or unspecified: Principal | ICD-10-CM | POA: Diagnosis present

## 2020-05-23 LAB — CBC
HCT: 40.5 % (ref 36.0–46.0)
Hemoglobin: 13 g/dL (ref 12.0–15.0)
MCH: 26.5 pg (ref 26.0–34.0)
MCHC: 32.1 g/dL (ref 30.0–36.0)
MCV: 82.7 fL (ref 80.0–100.0)
Platelets: 208 10*3/uL (ref 150–400)
RBC: 4.9 MIL/uL (ref 3.87–5.11)
RDW: 15.5 % (ref 11.5–15.5)
WBC: 10.8 10*3/uL — ABNORMAL HIGH (ref 4.0–10.5)
nRBC: 0 % (ref 0.0–0.2)

## 2020-05-23 LAB — TYPE AND SCREEN
ABO/RH(D): O POS
Antibody Screen: NEGATIVE

## 2020-05-23 LAB — SARS CORONAVIRUS 2 BY RT PCR (HOSPITAL ORDER, PERFORMED IN ~~LOC~~ HOSPITAL LAB): SARS Coronavirus 2: NEGATIVE

## 2020-05-23 MED ORDER — DIBUCAINE (PERIANAL) 1 % EX OINT
1.0000 "application " | TOPICAL_OINTMENT | CUTANEOUS | Status: DC | PRN
  Administered 2020-05-23: 1 via RECTAL
  Filled 2020-05-23: qty 28

## 2020-05-23 MED ORDER — LACTATED RINGERS IV SOLN: 500.0000 mL | Freq: Once | INTRAVENOUS | Status: DC

## 2020-05-23 MED ORDER — OXYTOCIN-SODIUM CHLORIDE 30-0.9 UT/500ML-% IV SOLN: 2.5000 [IU]/h | INTRAVENOUS | Status: DC

## 2020-05-23 MED ORDER — ONDANSETRON HCL 4 MG/2ML IJ SOLN: 4.0000 mg | Freq: Four times a day (QID) | INTRAMUSCULAR | Status: DC | PRN

## 2020-05-23 MED ORDER — FENTANYL-BUPIVACAINE-NACL 0.5-0.125-0.9 MG/250ML-% EP SOLN
12.0000 mL/h | EPIDURAL | Status: DC | PRN
  Administered 2020-05-23: 12 mL/h via EPIDURAL
  Filled 2020-05-23: qty 250

## 2020-05-23 MED ORDER — SENNOSIDES-DOCUSATE SODIUM 8.6-50 MG PO TABS
2.0000 | ORAL_TABLET | Freq: Every day | ORAL | Status: DC
  Administered 2020-05-24 – 2020-05-25 (×2): 2 via ORAL
  Filled 2020-05-23 (×2): qty 2

## 2020-05-23 MED ORDER — ACETAMINOPHEN 325 MG PO TABS
650.0000 mg | ORAL_TABLET | ORAL | Status: DC | PRN
  Administered 2020-05-24: 650 mg via ORAL
  Filled 2020-05-23: qty 2

## 2020-05-23 MED ORDER — METHYLERGONOVINE MALEATE 0.2 MG/ML IJ SOLN: 0.2000 mg | INTRAMUSCULAR | Status: DC | PRN

## 2020-05-23 MED ORDER — TERBUTALINE SULFATE 1 MG/ML IJ SOLN: 0.2500 mg | Freq: Once | INTRAMUSCULAR | Status: DC | PRN

## 2020-05-23 MED ORDER — TETANUS-DIPHTH-ACELL PERTUSSIS 5-2.5-18.5 LF-MCG/0.5 IM SUSY: 0.5000 mL | PREFILLED_SYRINGE | Freq: Once | INTRAMUSCULAR | Status: DC

## 2020-05-23 MED ORDER — COCONUT OIL OIL: 1.0000 "application " | TOPICAL_OIL | Status: DC | PRN

## 2020-05-23 MED ORDER — EPHEDRINE 5 MG/ML INJ: 10.0000 mg | INTRAVENOUS | Status: DC | PRN

## 2020-05-23 MED ORDER — DIPHENHYDRAMINE HCL 50 MG/ML IJ SOLN: 12.5000 mg | INTRAMUSCULAR | Status: DC | PRN

## 2020-05-23 MED ORDER — PHENYLEPHRINE 40 MCG/ML (10ML) SYRINGE FOR IV PUSH (FOR BLOOD PRESSURE SUPPORT): 80.0000 ug | PREFILLED_SYRINGE | INTRAVENOUS | Status: DC | PRN

## 2020-05-23 MED ORDER — LACTATED RINGERS IV SOLN: 500.0000 mL | INTRAVENOUS | Status: DC | PRN

## 2020-05-23 MED ORDER — METHYLERGONOVINE MALEATE 0.2 MG PO TABS: 0.2000 mg | ORAL_TABLET | ORAL | Status: DC | PRN

## 2020-05-23 MED ORDER — OXYTOCIN BOLUS FROM INFUSION
333.0000 mL | Freq: Once | INTRAVENOUS | Status: AC
  Administered 2020-05-23: 333 mL via INTRAVENOUS

## 2020-05-23 MED ORDER — BENZOCAINE-MENTHOL 20-0.5 % EX AERO
1.0000 "application " | INHALATION_SPRAY | CUTANEOUS | Status: DC | PRN
  Administered 2020-05-23 – 2020-05-25 (×2): 1 via TOPICAL
  Filled 2020-05-23 (×2): qty 56

## 2020-05-23 MED ORDER — LIDOCAINE-EPINEPHRINE (PF) 2 %-1:200000 IJ SOLN
INTRAMUSCULAR | Status: DC | PRN
  Administered 2020-05-23: 5 mL via EPIDURAL

## 2020-05-23 MED ORDER — SIMETHICONE 80 MG PO CHEW
80.0000 mg | CHEWABLE_TABLET | ORAL | Status: DC | PRN
Start: 2020-05-23 — End: 2020-05-25
  Administered 2020-05-25: 80 mg via ORAL
  Filled 2020-05-23: qty 1

## 2020-05-23 MED ORDER — OXYTOCIN-SODIUM CHLORIDE 30-0.9 UT/500ML-% IV SOLN
1.0000 m[IU]/min | INTRAVENOUS | Status: DC
  Administered 2020-05-23: 2 m[IU]/min via INTRAVENOUS
  Filled 2020-05-23: qty 500

## 2020-05-23 MED ORDER — LIDOCAINE HCL (PF) 1 % IJ SOLN
30.0000 mL | INTRAMUSCULAR | Status: AC | PRN
  Administered 2020-05-23: 30 mL via SUBCUTANEOUS
  Filled 2020-05-23: qty 30

## 2020-05-23 MED ORDER — ACETAMINOPHEN 325 MG PO TABS: 650.0000 mg | ORAL_TABLET | ORAL | Status: DC | PRN

## 2020-05-23 MED ORDER — ONDANSETRON HCL 4 MG PO TABS
4.0000 mg | ORAL_TABLET | ORAL | Status: DC | PRN
  Administered 2020-05-24 – 2020-05-25 (×2): 4 mg via ORAL
  Filled 2020-05-23 (×2): qty 1

## 2020-05-23 MED ORDER — IBUPROFEN 600 MG PO TABS
600.0000 mg | ORAL_TABLET | Freq: Four times a day (QID) | ORAL | Status: DC
  Administered 2020-05-23 – 2020-05-24 (×2): 600 mg via ORAL
  Filled 2020-05-23 (×3): qty 1

## 2020-05-23 MED ORDER — LACTATED RINGERS AMNIOINFUSION: INTRAVENOUS | Status: DC

## 2020-05-23 MED ORDER — DIPHENHYDRAMINE HCL 25 MG PO CAPS: 25.0000 mg | ORAL_CAPSULE | Freq: Four times a day (QID) | ORAL | Status: DC | PRN

## 2020-05-23 MED ORDER — ONDANSETRON HCL 4 MG/2ML IJ SOLN: 4.0000 mg | INTRAMUSCULAR | Status: DC | PRN

## 2020-05-23 MED ORDER — PRENATAL MULTIVITAMIN CH
1.0000 | ORAL_TABLET | Freq: Every day | ORAL | Status: DC
  Administered 2020-05-24 – 2020-05-25 (×2): 1 via ORAL
  Filled 2020-05-23 (×2): qty 1

## 2020-05-23 MED ORDER — WITCH HAZEL-GLYCERIN EX PADS: 1.0000 "application " | MEDICATED_PAD | CUTANEOUS | Status: DC | PRN

## 2020-05-23 MED ORDER — ZOLPIDEM TARTRATE 5 MG PO TABS: 5.0000 mg | ORAL_TABLET | Freq: Every evening | ORAL | Status: DC | PRN

## 2020-05-23 MED ORDER — SOD CITRATE-CITRIC ACID 500-334 MG/5ML PO SOLN: 30.0000 mL | ORAL | Status: DC | PRN

## 2020-05-23 MED ORDER — LACTATED RINGERS IV SOLN: INTRAVENOUS | Status: DC

## 2020-05-23 NOTE — Anesthesia Preprocedure Evaluation (Signed)
Anesthesia Evaluation  Patient identified by MRN, date of birth, ID band Patient awake    Reviewed: Allergy & Precautions, NPO status , Patient's Chart, lab work & pertinent test results  Airway Mallampati: II  TM Distance: >3 FB Neck ROM: Full    Dental no notable dental hx.    Pulmonary asthma ,    Pulmonary exam normal breath sounds clear to auscultation       Cardiovascular negative cardio ROS Normal cardiovascular exam Rhythm:Regular Rate:Normal     Neuro/Psych  Headaches, negative psych ROS   GI/Hepatic negative GI ROS, Neg liver ROS,   Endo/Other  negative endocrine ROS  Renal/GU negative Renal ROS  negative genitourinary   Musculoskeletal negative musculoskeletal ROS (+)   Abdominal   Peds  Hematology negative hematology ROS (+)   Anesthesia Other Findings TOLAC IOL for polyhydraminos  Reproductive/Obstetrics (+) Pregnancy                             Anesthesia Physical Anesthesia Plan  ASA: II  Anesthesia Plan: Epidural   Post-op Pain Management:    Induction:   PONV Risk Score and Plan: Treatment may vary due to age or medical condition  Airway Management Planned: Natural Airway  Additional Equipment:   Intra-op Plan:   Post-operative Plan:   Informed Consent: I have reviewed the patients History and Physical, chart, labs and discussed the procedure including the risks, benefits and alternatives for the proposed anesthesia with the patient or authorized representative who has indicated his/her understanding and acceptance.       Plan Discussed with: Anesthesiologist  Anesthesia Plan Comments: (Patient identified. Risks, benefits, options discussed with patient including but not limited to bleeding, infection, nerve damage, paralysis, failed block, incomplete pain control, headache, blood pressure changes, nausea, vomiting, reactions to medication, itching, and  post partum back pain. Confirmed with bedside nurse the patient's most recent platelet count. Confirmed with the patient that they are not taking any anticoagulation, have any bleeding history or any family history of bleeding disorders. Patient expressed understanding and wishes to proceed. All questions were answered. )        Anesthesia Quick Evaluation

## 2020-05-23 NOTE — Progress Notes (Signed)
S: Feeling well. Husband at the bedside. Discussed the R/B/A of TOLAC with patient and husband. Patient verbalizes understanding.   O: Vitals:   05/23/20 1219  BP: 129/72  Pulse: 98  Resp: 18  Temp: 98.7 F (37.1 C)  TempSrc: Oral  Weight: 100.4 kg  Height: 5\' 7"  (1.702 m)   FHT:  FHR: 140 bpm, variability: moderate,  accelerations:  Present,  decelerations:  Absent UC:   occasional SVE:   Dilation: 2 Effacement (%): 60 Station: Ballotable Exam by:: [email or web address], CNM   Vertex verified by bedside ultrasound.   A / P: Induction of labor due to polyhydramnios  Fetal Wellbeing:  Category I Pain Control:  Labor support without medications Anticipated MOD:  NSVD   Will start Pitocin 2x2 until adequate contractions. Consider controlled AROM in active labor.   Dr. Yetta Barre updated on patient status and plan of care.   Billy Coast, CNM, MSN 05/23/2020, 1:32 PM

## 2020-05-23 NOTE — Progress Notes (Signed)
Vickie Maldonado is a 30 y.o. G2P1001 at [redacted]w[redacted]d by LMP admitted for induction of labor due to Hydramnios.  Subjective: Getting comfortable after epidural  Objective: BP 128/77   Pulse 62   Temp 98.9 F (37.2 C) (Oral)   Resp 16   Ht 5\' 7"  (1.702 m)   Wt 100.4 kg   LMP 06/22/2019   BMI 34.66 kg/m  No intake/output data recorded. No intake/output data recorded.  FHT:  FHR: 140s bpm, variability: moderate,  accelerations:  Present,  decelerations:  Absent UC:   regular, every 3 minutes SVE:   5+/80/0/ROT  Labs: Lab Results  Component Value Date   WBC 10.8 (H) 05/23/2020   HGB 13.0 05/23/2020   HCT 40.5 05/23/2020   MCV 82.7 05/23/2020   PLT 208 05/23/2020    Assessment / Plan: Induction of labor due to severe poly,  progressing well on pitocin  Labor: Progressing normally Preeclampsia:  no signs or symptoms of toxicity Fetal Wellbeing:  Category I Pain Control:  Epidural I/D:  na Anticipated MOD:  guarded  Vickie Maldonado J 05/23/2020, 6:48 PM

## 2020-05-23 NOTE — Anesthesia Procedure Notes (Signed)
Epidural Patient location during procedure: OB Start time: 05/23/2020 6:25 PM End time: 05/23/2020 6:35 PM  Staffing Anesthesiologist: Elmer Picker, MD Performed: anesthesiologist   Preanesthetic Checklist Completed: patient identified, IV checked, risks and benefits discussed, monitors and equipment checked, pre-op evaluation and timeout performed  Epidural Patient position: sitting Prep: DuraPrep and site prepped and draped Patient monitoring: continuous pulse ox, blood pressure, heart rate and cardiac monitor Approach: midline Location: L3-L4 Injection technique: LOR air  Needle:  Needle type: Tuohy  Needle gauge: 17 G Needle length: 9 cm Needle insertion depth: 4 cm Catheter type: closed end flexible Catheter size: 19 Gauge Catheter at skin depth: 10 cm Test dose: negative  Assessment Sensory level: T8 Events: blood not aspirated, injection not painful, no injection resistance, no paresthesia and negative IV test  Additional Notes Patient identified. Risks/Benefits/Options discussed with patient including but not limited to bleeding, infection, nerve damage, paralysis, failed block, incomplete pain control, headache, blood pressure changes, nausea, vomiting, reactions to medication both or allergic, itching and postpartum back pain. Confirmed with bedside nurse the patient's most recent platelet count. Confirmed with patient that they are not currently taking any anticoagulation, have any bleeding history or any family history of bleeding disorders. Patient expressed understanding and wished to proceed. All questions were answered. Sterile technique was used throughout the entire procedure. Please see nursing notes for vital signs. Test dose was given through epidural catheter and negative prior to continuing to dose epidural or start infusion. Warning signs of high block given to the patient including shortness of breath, tingling/numbness in hands, complete motor block, or  any concerning symptoms with instructions to call for help. Patient was given instructions on fall risk and not to get out of bed. All questions and concerns addressed with instructions to call with any issues or inadequate analgesia.  Reason for block:procedure for pain

## 2020-05-23 NOTE — H&P (Signed)
Vickie Maldonado is a 30 y.o. female presenting for IOL for severe polyhydramnios. OB History    Gravida  2   Para  1   Term  1   Preterm      AB      Living  1     SAB      IAB      Ectopic      Multiple  0   Live Births  1          Past Medical History:  Diagnosis Date  . Anemia   . 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 Headache   . Sinus tachycardia    Past Surgical History:  Procedure Laterality Date  . CESAREAN SECTION N/A 06/04/2018   Procedure: CESAREAN SECTION;  Surgeon: [name], MD;  Location: [facility] BIRTHING SUIT[facility];  Service: Obstetrics;  Laterality: N/A;  . WISDOM TOOTH EXTRACTION     Family History: family history includes Breast cancer in her maternal aunt and maternal grandmother; Hypertension in her father, mother, and paternal grandfather. Social History:  reports that she has never smoked. She has never used smokeless tobacco. She reports previous alcohol use. She reports that she does not use drugs.     Maternal Diabetes: No Genetic Screening: Normal Maternal Ultrasounds/Referrals: Normal Fetal Ultrasounds or other Referrals:  None Maternal Substance Abuse:  No Significant Maternal Medications:  None Significant Maternal Lab Results:  Group B Strep negative Other Comments:  None  Review of Systems  Constitutional: Negative.   All other systems reviewed and are negative.  Maternal Medical History:  Reason for admission: Contractions.   Contractions: Onset was 1 week or more ago.   Frequency: irregular.   Perceived severity is moderate.    Fetal activity: Perceived fetal activity is normal.   Last perceived fetal movement was within the past hour.    Prenatal complications: Polyhydramnios.   Prenatal Complications - Diabetes: none.    Dilation: 2.5 Effacement (%): 60 Station: -3,-2 Exam by:: Dr. Billy Coast Blood pressure 129/72, pulse 98, temperature 98.7 F (37.1 C), temperature source Oral, resp. rate 18, height 5'  7" (1.702 m), weight 100.4 kg, last menstrual period 06/22/2019, unknown if currently breastfeeding. Maternal Exam:  Uterine Assessment: Contraction strength is mild.  Contraction frequency is irregular.   Abdomen: Patient reports no abdominal tenderness. Surgical scars: low transverse.   Fetal presentation: vertex  Introitus: Normal vulva. Normal vagina.  Ferning test: positive.  Nitrazine test: positive. Amniotic fluid character: not assessed.  Pelvis: questionable for delivery.   Cervix: Cervix evaluated by digital exam.     Physical Exam Vitals and nursing note reviewed. Exam conducted with a chaperone present.  Constitutional:      Appearance: Normal appearance. She is normal weight.  HENT:     Head: Normocephalic and atraumatic.  Cardiovascular:     Rate and Rhythm: Regular rhythm. Tachycardia present.     Pulses: Normal pulses.     Heart sounds: Normal heart sounds.  Pulmonary:     Effort: Pulmonary effort is normal.     Breath sounds: Normal breath sounds.  Abdominal:     General: Abdomen is flat.     Palpations: Abdomen is soft.  Genitourinary:    General: Normal vulva.  Musculoskeletal:        General: Normal range of motion.     Cervical back: Normal range of motion and neck supple.  Skin:    General: Skin is dry.  Neurological:     General: No focal deficit present.     Mental Status: She is alert and oriented to person, place, and time.  Psychiatric:        Mood and Affect: Mood normal.        Behavior: Behavior normal.     VE: 2-3 /50 -3 Contolled AROM with contraction Clear and copious Prenatal labs: ABO, Rh: --/--/O POS (02/02 1155) Antibody: NEG (02/02 1155) Rubella:   RPR: NON REACTIVE (01/13 1135)  HBsAg:   neg HIV:   neg GBS:   neg  Assessment/Plan: 37 w 6d IUP TOLAC Severe polyhydramnios IOL   Amayah Staheli J 05/23/2020, 1:57 PM

## 2020-05-23 NOTE — Plan of Care (Signed)
  Problem: Education: Goal: Knowledge of Childbirth will improve Outcome: Adequate for Discharge Goal: Ability to make informed decisions regarding treatment and plan of care will improve Outcome: Adequate for Discharge Goal: Ability to state and carry out methods to decrease the pain will improve Outcome: Adequate for Discharge Goal: Individualized Educational Video(s) Outcome: Adequate for Discharge   Problem: Coping: Goal: Ability to verbalize concerns and feelings about labor and delivery will improve Outcome: Adequate for Discharge   Problem: Life Cycle: Goal: Ability to make normal progression through stages of labor will improve Outcome: Adequate for Discharge Goal: Ability to effectively push during vaginal delivery will improve Outcome: Adequate for Discharge

## 2020-05-24 DIAGNOSIS — O34219 Maternal care for unspecified type scar from previous cesarean delivery: Secondary | ICD-10-CM

## 2020-05-24 LAB — CBC
HCT: 34.7 % — ABNORMAL LOW (ref 36.0–46.0)
Hemoglobin: 10.9 g/dL — ABNORMAL LOW (ref 12.0–15.0)
MCH: 26.2 pg (ref 26.0–34.0)
MCHC: 31.4 g/dL (ref 30.0–36.0)
MCV: 83.4 fL (ref 80.0–100.0)
Platelets: 176 10*3/uL (ref 150–400)
RBC: 4.16 MIL/uL (ref 3.87–5.11)
RDW: 15.4 % (ref 11.5–15.5)
WBC: 17.2 10*3/uL — ABNORMAL HIGH (ref 4.0–10.5)
nRBC: 0 % (ref 0.0–0.2)

## 2020-05-24 LAB — RPR: RPR Ser Ql: NONREACTIVE

## 2020-05-24 MED ORDER — DOCUSATE SODIUM 100 MG PO CAPS
100.0000 mg | ORAL_CAPSULE | Freq: Two times a day (BID) | ORAL | Status: DC
  Administered 2020-05-24 – 2020-05-25 (×3): 100 mg via ORAL
  Filled 2020-05-24 (×3): qty 1

## 2020-05-24 MED ORDER — ACETAMINOPHEN 500 MG PO TABS
1000.0000 mg | ORAL_TABLET | Freq: Four times a day (QID) | ORAL | Status: DC
  Administered 2020-05-24 – 2020-05-25 (×5): 1000 mg via ORAL
  Filled 2020-05-24 (×5): qty 2

## 2020-05-24 MED ORDER — IBUPROFEN 800 MG PO TABS
800.0000 mg | ORAL_TABLET | Freq: Four times a day (QID) | ORAL | Status: DC
  Administered 2020-05-24 – 2020-05-25 (×5): 800 mg via ORAL
  Filled 2020-05-24 (×5): qty 1

## 2020-05-24 MED ORDER — HYDROCORT-PRAMOXINE (PERIANAL) 1-1 % EX FOAM
1.0000 | Freq: Two times a day (BID) | CUTANEOUS | Status: DC
  Administered 2020-05-24 – 2020-05-25 (×2): 1 via RECTAL
  Filled 2020-05-24 (×2): qty 10

## 2020-05-24 NOTE — Anesthesia Postprocedure Evaluation (Signed)
Anesthesia Post Note  Patient: Vickie Maldonado  Procedure(s) Performed: AN AD HOC LABOR EPIDURAL     Patient location during evaluation: Mother Baby Anesthesia Type: Epidural Level of consciousness: awake, awake and alert and oriented Pain management: pain level controlled Vital Signs Assessment: post-procedure vital signs reviewed and stable Respiratory status: spontaneous breathing, nonlabored ventilation and respiratory function stable Cardiovascular status: blood pressure returned to baseline and stable Postop Assessment: no headache, no backache, able to ambulate, no apparent nausea or vomiting and adequate PO intake Anesthetic complications: no   No complications documented.  Last Vitals:  Vitals:   05/23/20 2220 05/23/20 2345  BP: 140/80 127/77  Pulse: 85 98  Resp: 18 17  Temp: 37.3 C 37.2 C  SpO2: 100% 100%    Last Pain:  Vitals:   05/24/20 0821  TempSrc:   PainSc: 5    Pain Goal:                   Cleda Clarks

## 2020-05-24 NOTE — Progress Notes (Addendum)
PPD #1, s/p IOL for polyhydramnios, VAVD/VBAC, 3rd degree perineal, baby boy "Stetson"  S:  Reports feeling okay, c/o painful hemorrhoids, stinging pain and c/o cramping. Pt. Also reports some chest heaviness. Hx. Of covid with lung scarring. Denies pain or SOB.              Tolerating po/ No nausea or vomiting / Denies dizziness or SOB             Bleeding is moderate             Pain somewhat controlled with Motrin and Tylenol              Up ad lib / ambulatory / voiding QS without difficulty, but not feeling when bladder is full   Newborn breast feeding  / Circumcision - planning  O:               VS: BP 127/77 (BP Location: Left Arm)   Pulse 98   Temp 98.9 F (37.2 C) (Oral)   Resp 17   Ht 5\' 7"  (1.702 m)   Wt 100.4 kg   LMP 06/22/2019   SpO2 100%   Breastfeeding Unknown   BMI 34.66 kg/m    LABS:             Recent Labs    05/23/20 1159 05/24/20 0607  WBC 10.8* 17.2*  HGB 13.0 10.9*  PLT 208 176               Blood type: --/--/O POS (02/02 1155)  Rubella:       Immune                I&O: Intake/Output      02/02 0701 02/03 0700 02/03 0701 02/04 0700   I.V. (mL/kg) 1268.5 (12.6)    Total Intake(mL/kg) 1268.5 (12.6)    Urine (mL/kg/hr) 1000    Blood 754    Total Output 1754    Net -485.5           Flu: declines Tdap: declines Covid: declines              Physical Exam:             Alert and oriented X3  Lungs: Clear and unlabored  Heart: regular rate and rhythm / no murmurs  Abdomen: soft, non-tender, non-distended              Fundus: firm, non-tender, below umbilicus   Perineum: well approximated 3rd degree, large inflamed external hemorrhoid, not thrombosed   Lochia: appropriate   Extremities: trace LE edema, no calf pain or tenderness    A/P: PPD # 1, VAVD/VBAC  3rd degree perineal    - Add Colace BID  Large external hemorrhoids   - Proctofoam BID   - Tucks pad, Dibucaine, sitz baths    - Plan to resume rectal rockets upon discharge  ABL  Anemia     - Increase iron rich foods, defer oral FE for now due to 3rd degree and large painful hemorrhoids   Doing well - stable status  Routine post partum orders  Increase Motrin 800mg  every 6hrs and add Tylenol 1000mg  every 6hrs   Lactation support PRN  Encouraged to void every 2-3 hrs   Circ prior to d/c  Incentive spirometry q1hr PRN  Continue current care and anticipate d/c home tomorrow  04/04, MSN, CNM [facility]

## 2020-05-24 NOTE — Progress Notes (Signed)
MOB was referred for history of depression/anxiety. * Referral screened out by Clinical Social Worker because none of the following criteria appear to apply: ~ History of anxiety/depression during this pregnancy, or of post-partum depression following prior delivery. ~ Diagnosis of anxiety and/or depression within last 3 years OR * MOB's symptoms currently being treated with medication and/or therapy. Per further chart review, ,it is noted that MOB has active prescription  for Zoloft as of 05/20/20.    Please contact the Clinical Social Worker if needs arise, by MOB request, or if MOB scores greater than 9/yes to question 10 on [place] Postpartum Depression Screen.    Sherryn Pollino S. Yaviel Kloster, MSW, LCSW [facility] [phone]    

## 2020-05-25 DIAGNOSIS — K644 Residual hemorrhoidal skin tags: Secondary | ICD-10-CM | POA: Diagnosis present

## 2020-05-25 MED ORDER — HYDROCORT-PRAMOXINE (PERIANAL) 1-1 % EX FOAM: 1.0000 | Freq: Two times a day (BID) | CUTANEOUS | 1 refills | Status: DC

## 2020-05-25 MED ORDER — IBUPROFEN 800 MG PO TABS: 800.0000 mg | ORAL_TABLET | Freq: Four times a day (QID) | ORAL | 0 refills | Status: DC

## 2020-05-25 MED ORDER — ACETAMINOPHEN 500 MG PO TABS: 1000.0000 mg | ORAL_TABLET | Freq: Four times a day (QID) | ORAL | 0 refills | Status: DC

## 2020-05-25 MED ORDER — DOCUSATE SODIUM 100 MG PO CAPS: 100.0000 mg | ORAL_CAPSULE | Freq: Two times a day (BID) | ORAL | 0 refills | Status: DC

## 2020-05-25 MED ORDER — COCONUT OIL OIL: 1.0000 "application " | TOPICAL_OIL | 0 refills | Status: DC | PRN

## 2020-05-25 NOTE — Discharge Instructions (Signed)
Lactation outpatient support - home visit  Linnell Fulling RN, MHA, IBCLC at [employer]: Lactation Consultant  [email or web address] Mail: [email or web address] Tel: [phone]    Additional resources:  [facility] [email or web address]://ibconline.ca/information-[email or web address]   Chiropractic specialist   Dr. Iva Boop [email or web address]  Craniosacral therapy for baby  Kathryne Eriksson  [email or web address]    Sitz baths 2 times /day with warm water x 1 week May add herbals: 1 ounce dried comfrey leaf* 1 ounce calendula flowers 1 ounce lavender flowers 1/2 ounce dried uva ursi leaves 1/2 ounce witch hazel blossoms (if you can find them) 1/2 ounce dried sage leaf 1/2 cup sea salt Directions: Bring 2 quarts of water to a boil. Turn off heat, and place 1 ounce (approximately 1 large handful) of the above mixed herbs (not the salt) into the pot. Steep, covered, for 30 minutes.  Strain the liquid well with a fine mesh strainer, and discard the herb material. Add 2 quarts of liquid to the tub, along with the 1/2 cup of salt. This medicinal liquid can also be made into compresses and peri-rinses.  For hemorrhoid relief may add to prescription medications regimen: https://www.[facility].com/our-products/green-goddess-butt-balm GREEN GODDESS BUTT BALM This silly named liquid is great for the topical treatment of hemorrhoids, a common problem in pregnancy and after birth.  Best relief when chilled.  Made from fresh plantain leaves, witch hazel and spring water.    Contains: Jeanann Lewandowsky, Fresh Plantago Major Leaves, Spring Water

## 2020-05-25 NOTE — Lactation Note (Signed)
This note was copied from a baby's chart. Lactation Consultation Note  Patient Name: Vickie Maldonado JASNK'N Date: 05/25/2020 Reason for consult: Follow-up assessment;Early term 37-38.6wks;Infant weight loss;Other (Comment);Nipple pain/trauma (9 % weight loss, Wets and stools correlate with the weight loss. per mom breast are fuller and leaking today , milk coming in . nipples are sensitive. Per mom declined LC in LD and on MBU until today. receptive to consult due to 9 % weight loss, inc Bili) Age:28 hours  Serum Bili - 9.1 - on the border of low intermediate to High Intermediate.  Baby post circ , and woke up easily to feed.  Mom unable to sit on her bottom due to soreness.  Mom laid on her side , modified cross cradle and did well to latch , minimal assist with depth and comfort. Baby fed approx 20 mins with increased swallows and nipple well rounded when baby released. Latch score - 8/ satisfied.  See below for details and BF tools and education.  LC recommended due to 9 % weight loss and elevated Bili  , and sore bottom, could stand to post pump both breast 10 mins , feed back to baby after 4 feedings.  LC provided the [facility] resource brochure with resource numbers.   Maternal Data Has patient been taught Hand Expression?: Yes (per mom experienced and familiar with hand expressing) Does the patient have breastfeeding experience prior to this delivery?: Yes How long did the patient breastfeed?: per mom  Feeding Mother's Current Feeding Choice: Breast Milk  LATCH Score Latch: Grasps breast easily, tongue down, lips flanged, rhythmical sucking.  Audible Swallowing: Spontaneous and intermittent  Type of Nipple: Everted at rest and after stimulation  Comfort (Breast/Nipple): Filling, red/small blisters or bruises, mild/mod discomfort (per mom milk is coming in and leaking , breast fuller)  Hold (Positioning): Assistance needed to correctly position infant at breast and maintain  latch.  LATCH Score: 8   Lactation Tools Discussed/Used Tools: Shells;Pump;Flanges;Comfort gels Flange Size: 24;27 Breast pump type: Manual Pump Education: Setup, frequency, and cleaning;Milk Storage  Interventions Interventions: Breast feeding basics reviewed;Assisted with latch;Skin to skin;Breast massage;Hand express;Breast compression;Adjust position;Support pillows;Position options;Shells;Comfort gels;Hand pump;Education  Discharge Discharge Education: Engorgement and breast care;Warning signs for feeding baby;Other (comment) (mom had questions aboout her over the counter allergy meds Allegra and Cetirizine / informed mom they were both L2 and safe for breast feeding per Dr.Hale's medication resource booklet.) Pump: Manual;Personal WIC Program: No  Consult Status Consult Status: Complete    Kathrin Greathouse 05/25/2020, 1:33 PM

## 2020-05-25 NOTE — Discharge Summary (Signed)
OB Discharge Summary  Patient Name: Vickie Maldonado DOB: [birth year] MRN: [id number]  Date of admission: 05/23/2020 Delivering provider: Olivia Mackie   Admitting diagnosis: Encounter for induction of labor [Z34.90] Intrauterine pregnancy: [redacted]w[redacted]d     Secondary diagnosis: Patient Active Problem List   Diagnosis Date Noted  . Inflamed external hemorrhoid 05/25/2020  . Postpartum care following VAVD VBAC (2/2) 05/24/2020  . Third degree perineal laceration 05/24/2020  . VBAC, delivered 05/24/2020  . Encounter for induction of labor 05/23/2020  . Precordial chest pain 04/22/2020  . D-dimer, elevated 04/06/2020  . Asthma 04/06/2020  . History of COVID-19 04/06/2020  . Near syncope 03/12/2020  . Palpitations 03/01/2020  . Exertional dyspnea 03/01/2020   Additional problems:none   Date of discharge: 05/25/2020   Discharge diagnosis: Principal Problem:   Postpartum care following VAVD VBAC (2/2) Active Problems:   Encounter for induction of labor   Third degree perineal laceration   VBAC, delivered   Inflamed external hemorrhoid                                                              Post partum procedures:none  Augmentation: AROM and Pitocin Pain control: Local;Epidural  Laceration:3rd degree;Perineal  Episiotomy:None  Complications: None  Hospital course:  Induction of Labor With Vaginal Delivery   30 y.o. yo G2P2002 at [redacted]w[redacted]d was admitted to the hospital 05/23/2020 for induction of labor.  Indication for induction: severe polyhydramnious..  Patient had an uncomplicated labor course as follows: Membrane Rupture Time/Date: 1:49 PM ,05/23/2020   Delivery Method:Vaginal, Vacuum (Extractor)  Episiotomy: None  Lacerations:  3rd degree;Perineal  Details of delivery can be found in separate delivery note.  Patient had a routine postpartum course. Patient is discharged home 05/25/20.  Newborn Data: Birth date:05/23/2020  Birth time:7:54 PM  Gender:Female  Living status:Living   Apgars:7 ,8  Weight:3340 g   Physical exam  Vitals:   05/24/20 1138 05/24/20 1618 05/24/20 2200 05/25/20 0520  BP: 108/63 111/63 (!) 110/57 111/65  Pulse: 77 84 83 (!) 57  Resp: 19 19 18 17   Temp: 99.1 F (37.3 C) 98.2 F (36.8 C) 98.3 F (36.8 C) 98 F (36.7 C)  TempSrc: Oral Oral Oral Oral  SpO2: 100% 99%  99%  Weight:      Height:       General: alert, cooperative and no distress Lochia: appropriate Uterine Fundus: firm Perineum: repair intact, no edema, + inflamed hemorrhoid, partial thrombosis DVT Evaluation: No significant calf/ankle edema. Labs: Lab Results  Component Value Date   WBC 17.2 (H) 05/24/2020   HGB 10.9 (L) 05/24/2020   HCT 34.7 (L) 05/24/2020   MCV 83.4 05/24/2020   PLT 176 05/24/2020   No flowsheet data found. Edinburgh Postnatal Depression Scale Screening Tool 05/24/2020 06/07/2018  I have been able to laugh and see the funny side of things. 0 0  I have looked forward with enjoyment to things. 0 0  I have blamed myself unnecessarily when things went wrong. 2 2  I have been anxious or worried for no good reason. 2 2  I have felt scared or panicky for no good reason. 2 0  Things have been getting on top of me. 1 0  I have been so unhappy that I have had difficulty sleeping. 0  0  I have felt sad or miserable. 0 0  I have been so unhappy that I have been crying. 0 1  The thought of harming myself has occurred to me. 0 0  Edinburgh Postnatal Depression Scale Total 7 5   Vaccines: TDaP          declined         Flu             declined                    COVID-19 declined  Discharge instruction:  per After Visit Summary,  [facility] booklet and  "Understanding Mother & Baby Care" hospital booklet  After Visit Meds:  Allergies as of 05/25/2020      Reactions   Peanut-containing Drug Products Anaphylaxis   Oxycodone Nausea And Vomiting   Latex Hives      Medication List    STOP taking these medications   famotidine 20 MG tablet Commonly  known as: PEPCID   hydrocortisone 2.5 % ointment   hydrocortisone 25 MG suppository Commonly known as: ANUSOL-HC   Iron 325 (65 Fe) MG Tabs     TAKE these medications   acetaminophen 500 MG tablet Commonly known as: TYLENOL Take 2 tablets (1,000 mg total) by mouth every 6 (six) hours. What changed:   when to take this  reasons to take this   albuterol 108 (90 Base) MCG/ACT inhaler Commonly known as: VENTOLIN HFA Inhale 1-2 puffs into the lungs every 6 (six) hours as needed for wheezing or shortness of breath.   cetirizine 10 MG tablet Commonly known as: ZYRTEC Take 10 mg by mouth daily.   coconut oil Oil Apply 1 application topically as needed.   docusate sodium 100 MG capsule Commonly known as: COLACE Take 1 capsule (100 mg total) by mouth 2 (two) times daily.   hydrocortisone-pramoxine rectal foam Commonly known as: PROCTOFOAM-HC Place 1 applicator rectally 2 (two) times daily.   ibuprofen 800 MG tablet Commonly known as: ADVIL Take 1 tablet (800 mg total) by mouth every 6 (six) hours.   lidocaine 5 % ointment Commonly known as: XYLOCAINE Apply 1 application topically 4 (four) times daily as needed for mild pain.   sertraline 50 MG tablet Commonly known as: ZOLOFT Take 50 mg by mouth daily.            Discharge Care Instructions  (From admission, onward)         Start     Ordered   05/25/20 0000  Discharge wound care:       Comments: Sitz baths 2 times /day with warm water x 1 week. May add herbals: 1 ounce dried comfrey leaf* 1 ounce calendula flowers 1 ounce lavender flowers  Supplies can be found online at [employer] sources at [employer], [employer]  1/2 ounce dried uva ursi leaves 1/2 ounce witch hazel blossoms (if you can find them) 1/2 ounce dried sage leaf 1/2 cup sea salt Directions: Bring 2 quarts of water to a boil. Turn off heat, and place 1 ounce (approximately 1 large handful) of the above mixed herbs (not the  salt) into the pot. Steep, covered, for 30 minutes.  Strain the liquid well with a fine mesh strainer, and discard the herb material. Add 2 quarts of liquid to the tub, along with the 1/2 cup of salt. This medicinal liquid can also be made into compresses and peri-rinses.   05/25/20  1049          Diet: iron rich diet  Activity: Advance as tolerated. Pelvic rest for 6 weeks.   Postpartum contraception: Not Discussed  Newborn Data: Live born female  Birth Weight: 7 lb 5.8 oz (3340 g) APGAR: 7, 8  Newborn Delivery   Birth date/time: 05/23/2020 19:54:00 Delivery type: Vaginal, Vacuum (Extractor)      named Theresia Bough Baby Feeding: Breast Disposition:home with mother   Delivery Report:  Review the Delivery Report for details.    Follow up:  Follow-up Information    Olivia Mackie, MD. Schedule an appointment as soon as possible for a visit in 6 week(s).   Specialty: Obstetrics and Gynecology Contact information: [address] [place] [address] [phone]                 Signed: Cipriano Mile, MSN 05/25/2020, 10:50 AM

## 2020-05-31 ENCOUNTER — Encounter: Payer: Self-pay | Admitting: General Practice

## 2020-06-06 ENCOUNTER — Other Ambulatory Visit: Payer: Self-pay | Admitting: Pulmonary Disease

## 2021-05-20 ENCOUNTER — Encounter: Payer: Self-pay | Admitting: Nurse Practitioner

## 2021-05-20 ENCOUNTER — Ambulatory Visit (INDEPENDENT_AMBULATORY_CARE_PROVIDER_SITE_OTHER): Payer: Self-pay

## 2021-05-20 ENCOUNTER — Ambulatory Visit (INDEPENDENT_AMBULATORY_CARE_PROVIDER_SITE_OTHER): Payer: Managed Care, Other (non HMO) | Admitting: Nurse Practitioner

## 2021-05-20 ENCOUNTER — Telehealth: Payer: Self-pay | Admitting: Pulmonary Disease

## 2021-05-20 ENCOUNTER — Other Ambulatory Visit: Payer: Self-pay

## 2021-05-20 VITALS — BP 102/60 | HR 61 | Temp 98.7°F | Ht 67.0 in | Wt 182.0 lb

## 2021-05-20 DIAGNOSIS — Z8616 Personal history of COVID-19: Secondary | ICD-10-CM

## 2021-05-20 DIAGNOSIS — J4541 Moderate persistent asthma with (acute) exacerbation: Secondary | ICD-10-CM | POA: Diagnosis not present

## 2021-05-20 DIAGNOSIS — J4540 Moderate persistent asthma, uncomplicated: Secondary | ICD-10-CM

## 2021-05-20 LAB — CBC WITH DIFFERENTIAL/PLATELET
Basophils Absolute: 0.1 10*3/uL (ref 0.0–0.1)
Basophils Relative: 0.8 % (ref 0.0–3.0)
Eosinophils Absolute: 0.7 10*3/uL (ref 0.0–0.7)
Eosinophils Relative: 6.8 % — ABNORMAL HIGH (ref 0.0–5.0)
HCT: 39.3 % (ref 36.0–46.0)
Hemoglobin: 12.5 g/dL (ref 12.0–15.0)
Lymphocytes Relative: 26.6 % (ref 12.0–46.0)
Lymphs Abs: 2.7 10*3/uL (ref 0.7–4.0)
MCHC: 31.8 g/dL (ref 30.0–36.0)
MCV: 76 fl — ABNORMAL LOW (ref 78.0–100.0)
Monocytes Absolute: 0.6 10*3/uL (ref 0.1–1.0)
Monocytes Relative: 6.1 % (ref 3.0–12.0)
Neutro Abs: 6.2 10*3/uL (ref 1.4–7.7)
Neutrophils Relative %: 59.7 % (ref 43.0–77.0)
Platelets: 331 10*3/uL (ref 150.0–400.0)
RBC: 5.17 Mil/uL — ABNORMAL HIGH (ref 3.87–5.11)
RDW: 15.1 % (ref 11.5–15.5)
WBC: 10.3 10*3/uL (ref 4.0–10.5)

## 2021-05-20 LAB — NITRIC OXIDE: FeNO level (ppb): 60

## 2021-05-20 MED ORDER — PREDNISONE 10 MG PO TABS: ORAL_TABLET | ORAL | 0 refills | Status: DC

## 2021-05-20 NOTE — Patient Instructions (Addendum)
Continue Symbicort 160 2 puffs Twice daily. Brush tongue and rinse mouth afterwards. Continue Albuterol inhaler 2 puffs every 6 hours as needed for shortness of breath or wheezing. Notify if symptoms persist despite rescue inhaler use.  -Prednisone taper. 4 tabs for 2 days, then 3 tabs for 2 days, 2 tabs for 2 days, then 1 tab for 2 days, then stop. Take in AM with food.  Chest x ray today. We will notify you of any abnormal results.  Labs today - CBC with diff and IgE  Asthma Action Plan in place Rinse mouth after inhaled corticosteroid use.  Avoid triggers, when able.  Exercise encouraged. Notify if worsening symptoms upon exertion.  Notify and seek help if symptoms unrelieved by rescue inhaler.  Follow up in 1 month with Dr. Wynona Neat or Rhunette Croft NP. If symptoms do not improve or worsen, please contact office for sooner follow up or seek emergency care.

## 2021-05-20 NOTE — Assessment & Plan Note (Addendum)
Initial increase in SOB likely r/t to COVID infection in late November 2022. CXR clear today.

## 2021-05-20 NOTE — Progress Notes (Signed)
@Patient  ID: Vickie Maldonado, female    DOB: April 13, 1991, 31 y.o.   MRN: [id number]  Chief Complaint  Patient presents with   Follow-up    [place]     Referring provider: Mayra Neer, MD  HPI: 31 year old female, never smoker followed for asthma, exertional dyspnea. She is a patient of Dr. Judson Roch and last seen in office on 04/06/2020 by [facility]. Past medical history significant for palpitations and COVID, hx of elevated d dimer suspected to be r/t pregnancy.   TEST/EVENTS:  03/11/2020 echocardiogram: EF 55%. Mild MVR. Mild tricuspid regurg.   04/06/2020: [facility] with Warner Mccreedy, NP. CT from 03/01/2020 showed reticulonodular opacities in the base of the lungs concerning for multifocal infection. Walking oximetry without desat. Elevated d dimer with negative doppler for DVT. Suspected elevation r/t pregnancy and did not see indication for VQ scan. ED precautions. F/u 3 months.   05/20/2021: Today - acute visit Patient presents today for shortness of breath, primarily upon exertion but occasionally at rest. She has noticed worsening in her breathing since having COVID at the end of November, and over the past 4-5 days she has been coughing and having to use her albuterol multiple times a day. Her cough is non-productive. She has noticed occasional wheezing. She has two kids and usually takes them on walks but has been unable to do so this week due to her breathing. She denies fevers, orthopnea, PND, chest pain or lower extremity swelling. She is currently on Symbicort Twice daily, and believes it is 160 but not positive. She has been on Singulair in the past but experienced night terrors with it.   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     cetirizine (ZYRTEC) 10 MG tablet Take 10 mg by mouth daily.     acetaminophen (TYLENOL) 500 MG tablet Take 2 tablets (1,000 mg total) by mouth every 6 (six) hours. 30 tablet 0   coconut oil OIL Apply 1 application topically as needed.  0   docusate sodium (COLACE) 100 MG capsule Take 1 capsule (100 mg total) by mouth 2 (two) times daily. 10 capsule 0   hydrocortisone-pramoxine (PROCTOFOAM-HC) rectal foam Place 1 applicator rectally 2 (two) times daily. 10 g 1   ibuprofen (ADVIL) 800 MG tablet Take 1 tablet (800 mg total) by mouth every 6 (six) hours. 30 tablet 0   lidocaine (XYLOCAINE) 5 % ointment Apply 1 application topically 4 (four) times daily as needed for mild pain.     sertraline (ZOLOFT) 50 MG tablet Take 50 mg by mouth daily.     No facility-administered medications prior to visit.     Review of Systems:  Constitutional: No weight loss or gain, night sweats, fevers, chills, fatigue, or lassitude. HEENT: No headaches, difficulty swallowing, tooth/dental problems, or sore throat. No sneezing, itching, ear ache, nasal congestion, or post nasal drip CV:  No chest pain, orthopnea, PND, swelling in lower extremities, anasarca, dizziness, palpitations, syncope Resp: +shortness of breath with exertion and at rest; dry cough; occasional wheeze. No excess mucus or change in color of mucus. No hemoptysis. No chest wall deformity GI:  No heartburn,  indigestion, abdominal pain, nausea, vomiting, diarrhea, change in bowel habits, loss of appetite, bloody stools.  GU: No dysuria, change in color of urine, urgency or frequency.  No flank pain, no hematuria  Skin: No rash, lesions, ulcerations MSK:  No joint pain or swelling.  No decreased range of motion.  No back pain. Neuro: No dizziness or lightheadedness.  Psych: No depression or anxiety. Mood stable.     Physical Exam:  BP 102/60 (BP Location: Right Arm, Cuff Size: Normal)    Pulse 61    Temp 98.7 F (37.1 C) (Oral)    Ht 5\' 7"  (1.702 m)    Wt 182 lb (82.6 kg)    SpO2 98%    BMI 28.51 kg/m   GEN: Pleasant, interactive, well-nourished; in no acute distress. HEENT:  Normocephalic and atraumatic. EACs patent bilaterally. TM pearly gray with present light reflex bilaterally. PERRLA. Sclera white. Nasal turbinates pink, moist and patent bilaterally. No rhinorrhea present. Oropharynx pink and moist, without exudate or edema. No lesions, ulcerations, or postnasal drip.  NECK:  Supple w/ fair ROM. No JVD present. Normal carotid impulses w/o bruits. Thyroid symmetrical with no goiter or nodules palpated. No lymphadenopathy.   CV: RRR, no m/r/g, no peripheral edema. Pulses intact, +2 bilaterally. No cyanosis, pallor or clubbing. PULMONARY:  Unlabored, regular breathing. Clear bilaterally A&P w/o wheezes/rales/rhonchi. No accessory muscle use. No dullness to percussion. GI: BS present and normoactive. Soft, non-tender to palpation. No organomegaly or masses detected. No CVA tenderness. MSK: No erythema, warmth or tenderness. Cap refil <2 sec all extrem. No deformities or joint swelling noted.  Neuro: A/Ox3. No focal deficits noted.   Skin: Warm, no lesions or rashe Psych: Normal affect and behavior. Judgement and thought content appropriate.     Lab Results:  CBC    Component Value Date/Time   WBC 10.3 05/20/2021 1546   RBC 5.17 (H) 05/20/2021 1546   HGB 12.5 05/20/2021 1546   HCT  39.3 05/20/2021 1546   PLT 331.0 05/20/2021 1546   MCV 76.0 (L) 05/20/2021 1546   MCH 26.2 05/24/2020 0607   MCHC 31.8 05/20/2021 1546   RDW 15.1 05/20/2021 1546   LYMPHSABS 2.7 05/20/2021 1546   MONOABS 0.6 05/20/2021 1546   EOSABS 0.7 05/20/2021 1546   BASOSABS 0.1 05/20/2021 1546    BMET No results found for: NA, K, CL, CO2, GLUCOSE, BUN, CREATININE, CALCIUM, GFRNONAA, GFRAA  BNP No results found for: BNP   Imaging:  DG Chest 2 View  Result Date: 05/20/2021 CLINICAL DATA:  Shortness of breath. Moderate persistent asthma without complication. History of COVID. EXAM: CHEST - 2 VIEW COMPARISON:  04/06/2020 FINDINGS: Both lungs are clear. Heart and mediastinum are within normal limits. Trachea is midline. Negative for a pneumothorax. No pleural effusions. No acute bone abnormality. IMPRESSION: No active cardiopulmonary disease. Electronically Signed   By: Markus Daft M.D.   On: 05/20/2021 16:16      No flowsheet data found.  No results found for: NITRICOXIDE  Assessment & Plan:   Asthma Current flare. FeNO 60 ppb. Tx with prednisone taper. CXR clear. Advised to determine if she is on Symbicort 80 or 160 and notify once she gets home. If she is on 80, will step up therapy to 160. Continue PRN albuterol. CBC with diff and IgE today.   Patient Instructions  Continue Symbicort 160 2 puffs Twice daily. Brush tongue and rinse mouth afterwards. Continue Albuterol inhaler 2 puffs every 6 hours as needed for shortness of breath or wheezing. Notify if symptoms persist despite rescue inhaler use.  -Prednisone taper. 4 tabs for 2 days, then 3 tabs for 2 days, 2 tabs for 2 days, then 1 tab for 2 days, then stop. Take in AM with food.  Chest x ray today. We will notify you of any abnormal results.  Labs today - CBC with diff and IgE  Asthma Action Plan in place Rinse mouth after inhaled corticosteroid use.  Avoid triggers, when able.  Exercise encouraged. Notify if worsening  symptoms upon exertion.  Notify and seek help if symptoms unrelieved by rescue inhaler.  Follow up in 1 month with Dr. Ander Slade or Roxan Diesel NP. If symptoms do not improve or worsen, please contact office for sooner follow up or seek emergency care.   History of COVID-19 Initial increase in SOB likely r/t to COVID infection in late November 2022. CXR clear today.     Clayton Bibles, NP 05/20/2021  Pt aware and understands NP's role.

## 2021-05-20 NOTE — Assessment & Plan Note (Signed)
Current flare. FeNO 60 ppb. Tx with prednisone taper. CXR clear. Advised to determine if she is on Symbicort 80 or 160 and notify once she gets home. If she is on 80, will step up therapy to 160. Continue PRN albuterol. CBC with diff and IgE today.   Patient Instructions  Continue Symbicort 160 2 puffs Twice daily. Brush tongue and rinse mouth afterwards. Continue Albuterol inhaler 2 puffs every 6 hours as needed for shortness of breath or wheezing. Notify if symptoms persist despite rescue inhaler use.  -Prednisone taper. 4 tabs for 2 days, then 3 tabs for 2 days, 2 tabs for 2 days, then 1 tab for 2 days, then stop. Take in AM with food.  Chest x ray today. We will notify you of any abnormal results.  Labs today - CBC with diff and IgE  Asthma Action Plan in place Rinse mouth after inhaled corticosteroid use.  Avoid triggers, when able.  Exercise encouraged. Notify if worsening symptoms upon exertion.  Notify and seek help if symptoms unrelieved by rescue inhaler.  Follow up in 1 month with Dr. Wynona Neat or Rhunette Croft NP. If symptoms do not improve or worsen, please contact office for sooner follow up or seek emergency care.

## 2021-05-20 NOTE — Telephone Encounter (Signed)
Lm for patient.  

## 2021-05-21 ENCOUNTER — Telehealth: Payer: Self-pay | Admitting: Pulmonary Disease

## 2021-05-21 LAB — IGE: IgE (Immunoglobulin E), Serum: 51 kU/L (ref <=114)

## 2021-05-21 MED ORDER — BUDESONIDE-FORMOTEROL FUMARATE 160-4.5 MCG/ACT IN AERO: 2.0000 | INHALATION_SPRAY | Freq: Two times a day (BID) | RESPIRATORY_TRACT | 6 refills | Status: DC

## 2021-05-21 NOTE — Telephone Encounter (Signed)
Ok to switch if Dr. Val Eagle approves. I would need a 30 min new patient visit slot please.

## 2021-05-21 NOTE — Telephone Encounter (Signed)
Will await Dr. Olalere's response. 

## 2021-05-21 NOTE — Telephone Encounter (Signed)
Spoke to patient.  She wanted to let Florentina Addison know that she is currently taking Symbicort 80. She will need Rx for Symicort 160 if Florentina Addison would like her to increase dosage.  She would also like to switch from Dr. Wynona Neat to Dr. Everardo All.  Katie, please advise on Symbicort.  Dr. Wynona Neat and Dr. Everardo All, please advise on switch?

## 2021-05-21 NOTE — Telephone Encounter (Signed)
Sent the rx for Symbicort 160 2 puffs Twice daily. Brush tongue and rinse mouth afterwards. Please let her know that her CBC came back and her eosinophils are elevated. Since she is unable to tolerate Singulair, we will see how she does on the increase Symbicort dosing; but if her symptoms return after finishing the prednisone, advise her to let us know because we may need to step up to a triple therapy inhaler. Thanks!

## 2021-05-21 NOTE — Progress Notes (Signed)
IgE normal. Pt has appt for allergy testing and will call to schedule follow up after per telephone note.

## 2021-05-21 NOTE — Progress Notes (Signed)
Eos absolute 0.7, relative 6.8%. Unable to tolerate Singulair d/t night terrors. Currently on ICS/LABA maintenance. Step up therapy to triple therapy. May need to consider biologic therapy if symptoms and eosinophilia persist.

## 2021-05-21 NOTE — Telephone Encounter (Signed)
Please see 05/20/2021 phone note.  °

## 2021-05-21 NOTE — Telephone Encounter (Signed)
Spoke to patient and relayed below recommendations and results.  Patient stated that she is scheduled for allergy testing 06/10/2021. She will call back if sx continue .  Will await response from Dr. Everardo All and Dr. Wynona Neat,

## 2021-05-22 NOTE — Telephone Encounter (Signed)
Patient is aware of results and voiced her understanding.  Nothing further needed.   

## 2021-05-22 NOTE — Telephone Encounter (Signed)
Called and spoke with pt letting her know the results of cxr and bloodwork and she verbalized understanding. Nothing further needed.

## 2021-05-22 NOTE — Telephone Encounter (Signed)
Switch is okay with me

## 2021-05-22 NOTE — Telephone Encounter (Signed)
It was normal! Lungs were both clear. It was routed to the normal results pool. Thanks!

## 2021-05-22 NOTE — Telephone Encounter (Addendum)
Appt scheduled 06/04/2021 at 3:30 with Dr. Loanne Drilling.  Patient is requesting CXR results.  Patient is aware of lab results and voiced her understanding.  Katie, please advise on CXR results, Thanks

## 2021-05-22 NOTE — Telephone Encounter (Signed)
Vickie Maldonado, please see mychart message sent by pt and advise:  [facility] Pool (supporting Nunn, Ruby Cola, NP) 17 hours ago (8:34 PM)   I was curious if my X-ray showed any change from the one I had in 2021. Also, if there was an findings as to why my asthma is so irritated. I also saw that my blood results came back and wanted to get more information on how those ended up looking.  Thanks!

## 2021-05-30 ENCOUNTER — Telehealth: Payer: Self-pay | Admitting: Pulmonary Disease

## 2021-05-30 MED ORDER — ALBUTEROL SULFATE HFA 108 (90 BASE) MCG/ACT IN AERS: 1.0000 | INHALATION_SPRAY | Freq: Four times a day (QID) | RESPIRATORY_TRACT | 1 refills | Status: AC | PRN

## 2021-05-30 NOTE — Telephone Encounter (Signed)
I have left a message for the patient to call back with any questions. A refill has been sent to the pharmacy.

## 2021-06-04 ENCOUNTER — Ambulatory Visit: Payer: Managed Care, Other (non HMO) | Admitting: Pulmonary Disease

## 2021-06-25 ENCOUNTER — Ambulatory Visit: Payer: Managed Care, Other (non HMO) | Admitting: Pulmonary Disease

## 2021-11-18 LAB — OB RESULTS CONSOLE HIV ANTIBODY (ROUTINE TESTING): HIV: NONREACTIVE

## 2021-11-18 LAB — OB RESULTS CONSOLE RUBELLA ANTIBODY, IGM: Rubella: IMMUNE

## 2021-11-18 LAB — OB RESULTS CONSOLE HEPATITIS B SURFACE ANTIGEN: Hepatitis B Surface Ag: NEGATIVE

## 2021-11-18 LAB — OB RESULTS CONSOLE RPR: RPR: NONREACTIVE

## 2021-12-16 LAB — OB RESULTS CONSOLE GC/CHLAMYDIA
Chlamydia: NEGATIVE
Neisseria Gonorrhea: NEGATIVE

## 2021-12-20 IMAGING — CR DG CHEST 1V
1 series · 1 of 1 positions shown · non-contrast
Comparison: None.

CLINICAL DATA: 29-year-old female with dyspnea

EXAM:
CHEST  1 VIEW

[w chest pa]
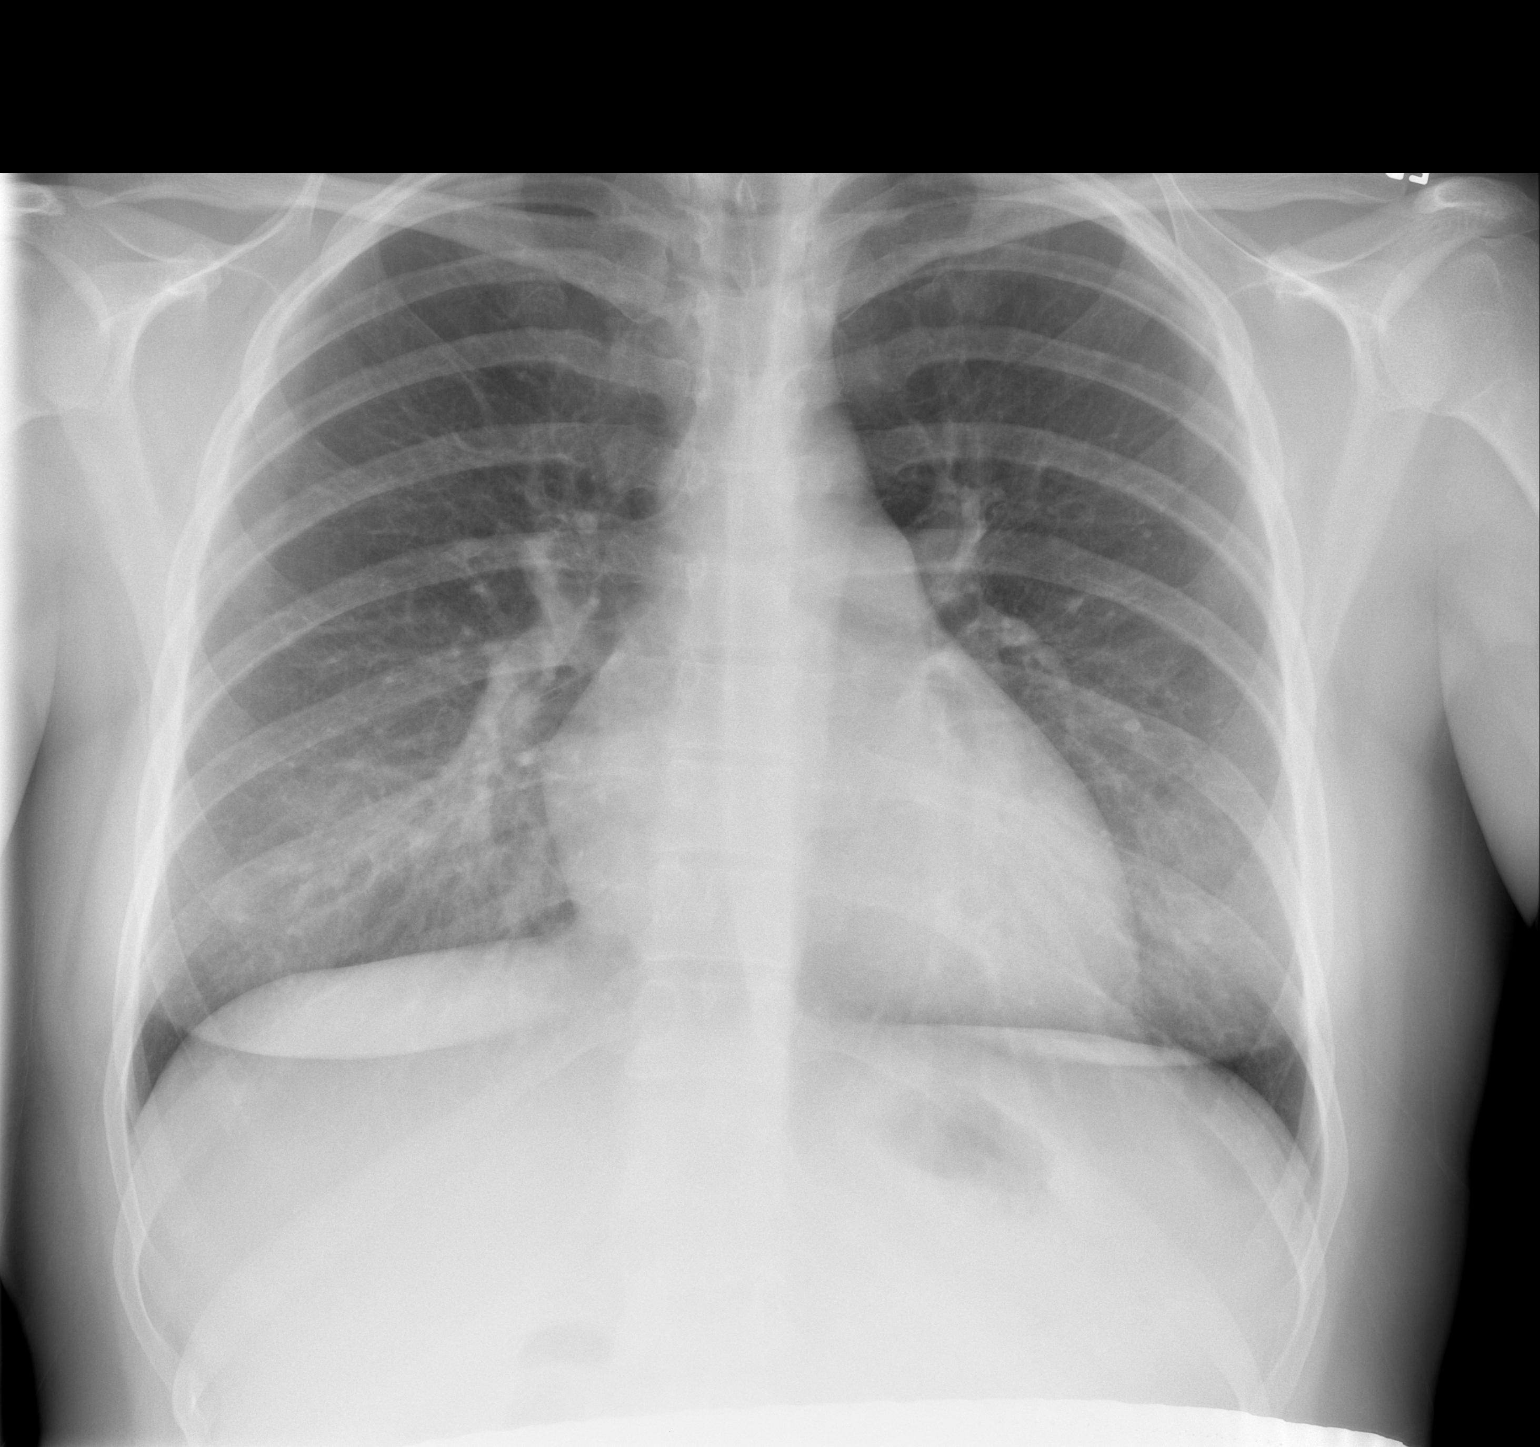

[1 of 1 positions shown; findings below may reference images not displayed]

FINDINGS: Cardiomediastinal silhouette within normal limits.

Reticular opacities at the bases of the lungs. No interlobular
septal thickening. No pneumothorax. No pleural effusion.
IMPRESSION: Vague reticulonodular opacities at the bases of the lungs,
concerning for multifocal infection.

## 2022-01-25 IMAGING — DX DG CHEST 1V
1 series · 1 of 1 positions shown · non-contrast
Comparison: 03/01/2020

CLINICAL DATA: COVID pneumonia.

EXAM:
CHEST  1 VIEW

[chest pa]
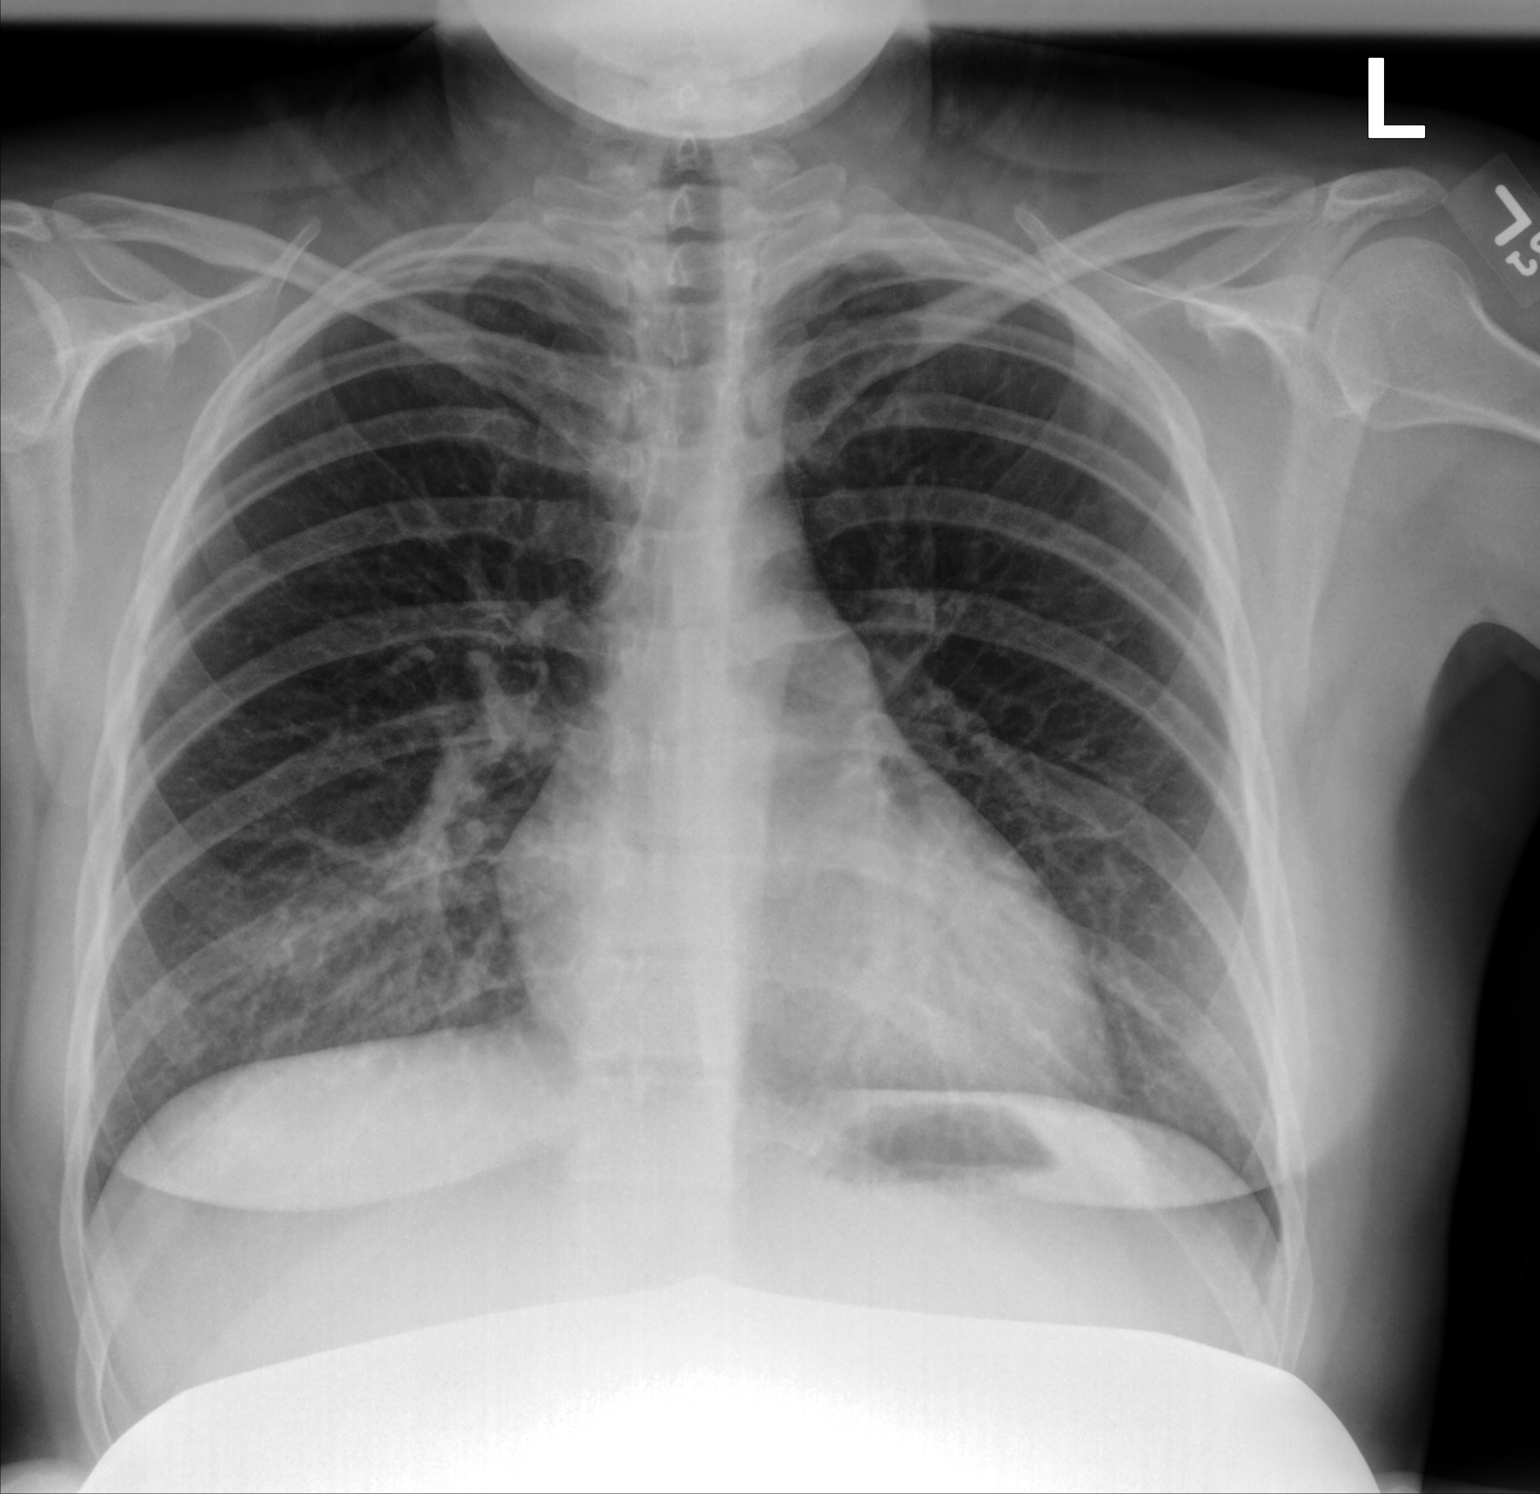

[1 of 1 positions shown; findings below may reference images not displayed]

FINDINGS: The cardiac silhouette, mediastinal and hilar contours are within
normal limits.

The lungs are clear of an acute process. No pulmonary infiltrates or
pleural effusion. Stable increased markings at the lung bases likely
due to overlying breast tissue. The bony thorax is intact.
IMPRESSION: No acute cardiopulmonary findings.

## 2022-04-21 NOTE — L&D Delivery Note (Signed)
[facility] Delivery Note  Vickie Maldonado is a 32 y.o. [id number] s/p VD at [redacted]w[redacted]d She was admitted for SOL.   ROM: 0h 060mith meconium stained fluid GBS Status:  Negative/-- (01/19 0000) Maximum Maternal Temperature: 99.3  Labor Progress: Admitted. Complete & pushing  Delivery Date/Time: 06/07/2022 @0741$  Delivery: Called to room and patient was complete and pushing. Head delivered LOA. Tight nuchal x2. Shoulder and body delivered in usual fashion. Infant with spontaneous cry, placed on mother's abdomen, dried and stimulated. Cord clamped x 2 after 2-minute delay, and cut by FOB. Cord blood drawn. Placenta delivered spontaneously with gentle cord traction. Fundus firm with massage and Pitocin. Labia, perineum, vagina, and cervix inspected with 2nd degree perineal laceration and right labia minor defect. Dr. AlMurrell Reddenas present for repair. See attestation.   Baby Weight: pending  Placenta: 3 vessel, intact. Sent to L&D Complications: Precipitous delivery Lacerations: 2nd perineal, right labial minor defect EBL: pending post repair Analgesia: Maternally supported  Infant:  APGAR (1 MIN): 8   APGAR (5 MINS): 9    Vickie Kiss Autry-Lott, DO OB Fellow, [employer]/17/2024, 8:23 AM

## 2022-04-23 ENCOUNTER — Encounter: Payer: Self-pay | Admitting: Advanced Practice Midwife

## 2022-04-23 ENCOUNTER — Inpatient Hospital Stay (HOSPITAL_COMMUNITY)
Admission: AD | Admit: 2022-04-23 | Discharge: 2022-04-24 | Disposition: A | Discharge status: Home / Self Care | Payer: No Typology Code available for payment source | Attending: Obstetrics and Gynecology | Admitting: Obstetrics and Gynecology

## 2022-04-23 DIAGNOSIS — O99513 Diseases of the respiratory system complicating pregnancy, third trimester: Secondary | ICD-10-CM | POA: Diagnosis not present

## 2022-04-23 DIAGNOSIS — Z3A33 33 weeks gestation of pregnancy: Secondary | ICD-10-CM | POA: Insufficient documentation

## 2022-04-23 DIAGNOSIS — Z1152 Encounter for screening for COVID-19: Secondary | ICD-10-CM | POA: Diagnosis not present

## 2022-04-23 DIAGNOSIS — O368130 Decreased fetal movements, third trimester, not applicable or unspecified: Secondary | ICD-10-CM | POA: Diagnosis present

## 2022-04-23 DIAGNOSIS — R051 Acute cough: Secondary | ICD-10-CM

## 2022-04-23 DIAGNOSIS — J398 Other specified diseases of upper respiratory tract: Secondary | ICD-10-CM | POA: Diagnosis not present

## 2022-04-23 DIAGNOSIS — J101 Influenza due to other identified influenza virus with other respiratory manifestations: Secondary | ICD-10-CM | POA: Insufficient documentation

## 2022-04-23 LAB — URINALYSIS, ROUTINE W REFLEX MICROSCOPIC
Bilirubin Urine: NEGATIVE
Glucose, UA: NEGATIVE mg/dL
Hgb urine dipstick: NEGATIVE
Ketones, ur: NEGATIVE mg/dL
Nitrite: NEGATIVE
Protein, ur: NEGATIVE mg/dL
Specific Gravity, Urine: 1.006 (ref 1.005–1.030)
pH: 7 (ref 5.0–8.0)

## 2022-04-23 LAB — RESP PANEL BY RT-PCR (RSV, FLU A&B, COVID)  RVPGX2
Influenza A by PCR: NEGATIVE
Influenza B by PCR: POSITIVE — AB
Resp Syncytial Virus by PCR: NEGATIVE
SARS Coronavirus 2 by RT PCR: NEGATIVE

## 2022-04-23 MED ORDER — LACTATED RINGERS IV BOLUS
1000.0000 mL | Freq: Once | INTRAVENOUS | Status: AC
  Administered 2022-04-23: 1000 mL via INTRAVENOUS

## 2022-04-23 NOTE — MAU Note (Signed)
.  Vickie Maldonado is a 32 y.o. at [redacted]w[redacted]d here in MAU reporting: DFM with less movement throughout the day last movement 45 mins, fever 101.5 temp unrelieved with Tylenol (next dose due at 12:45), SOB, generalized body aches, and congestion since yesterday evening. Pt states both her kids have Flu B. Pt reports she started Tamiflu first dose tonight 1930. Pt denies VB, LOF.  [name] Onset of complaint: yesterday  Pain score: 5/10 Vitals:   04/23/22 2226  BP: 135/67  Pulse: (!) 121  Resp: (!) 26  Temp: 100.3 F (37.9 C)  SpO2: 97%     FHT:152 Lab orders placed from triage:  UA

## 2022-04-23 NOTE — MAU Provider Note (Signed)
Chief Complaint:  Shortness of Breath, Fever, and Decreased Fetal Movement   Event Date/Time   First Provider Initiated Contact with Patient 04/23/22 2301     HPI: Vickie Maldonado is a 32 y.o. G3P2002 at 48w6dwho presents to maternity admissions reporting Decreased fetal movement tonight. Her children have had Influenza B this week and she now has a fever and other symptoms of the Flu.  She started taking Tamiflu today prescribed by her doctor's office.  Has fever, shortness of breath, congestion, sore throat and aches. . She denies LOF, vaginal bleeding, vaginal itching/burning, urinary symptoms, h/a, dizziness, n/v, diarrhea, constipation.    Shortness of Breath This is a new problem. The current episode started today. Associated symptoms include a fever, rhinorrhea, a sore throat and sputum production. Pertinent negatives include no abdominal pain, chest pain, headaches, leg swelling, vomiting or wheezing (had some earlier, relieved by inhaler). She has tried beta agonist inhalers and rest for the symptoms. The treatment provided mild relief.  Fever  This is a new problem. The current episode started yesterday. The maximum temperature noted was 101 to 101.9 F. Associated symptoms include congestion, coughing, muscle aches and a sore throat. Pertinent negatives include no abdominal pain, chest pain, diarrhea, headaches, nausea, vomiting or wheezing (had some earlier, relieved by inhaler). She has tried acetaminophen for the symptoms. The treatment provided mild relief.   RN note: Vickie Maldonado is a 32 y.o. at [redacted]w[redacted]d here in MAU reporting: DFM with less movement throughout the day last movement 45 mins, fever 101.5 temp unrelieved with Tylenol (next dose due at 12:45), SOB, generalized body aches, and congestion since yesterday evening. Pt states both her kids have Flu B. Pt reports she started Tamiflu first dose tonight 1930. Pt denies VB, LOF.  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   Headache    Sinus tachycardia     Past obstetric history: OB History  Gravida Para Term Preterm AB Living  3 2 2     2   SAB IAB Ectopic Multiple Live Births        0 2    # Outcome Date GA Lbr Len/2nd Weight Sex Delivery Anes PTL Lv  3 Current           2 Term 05/23/20 [redacted]w[redacted]d 00:52 / 00:17 3340 g M Vag-Vacuum Local, EPI  LIV  1 Term 06/05/18 [redacted]w[redacted]d  3450 g F CS-LTranv Spinal  LIV    Past Surgical History: Past Surgical History:  Procedure Laterality Date   CESAREAN SECTION N/A 06/04/2018   Procedure: CESAREAN SECTION;  Surgeon: Charyl Bigger, MD;  Location: [facility];  Service: Obstetrics;  Laterality: N/A;   WISDOM TOOTH EXTRACTION      Family History: Family History  Problem Relation Age of Onset   Hypertension Father    Breast cancer Maternal Aunt    Breast cancer Maternal Grandmother    Hypertension Paternal Grandfather    Hypertension Mother     Social History: Social History   Tobacco Use   Smoking status: Never   Smokeless tobacco: Never  Vaping Use   Vaping Use: Never used  Substance Use Topics   Alcohol use: Not Currently   Drug use: Never    Allergies:  Allergies  Allergen Reactions   Peanut-Containing Drug Products Anaphylaxis   Oxycodone  Nausea And Vomiting   Latex Hives    Meds:  Medications Prior to Admission  Medication Sig Dispense Refill Last Dose   albuterol (VENTOLIN HFA) 108 (90 Base) MCG/ACT inhaler Inhale 1-2 puffs into the lungs every 6 (six) hours as needed for wheezing or shortness of breath. 8 g 1    amoxicillin (AMOXIL) 875 MG tablet SMARTSIG:1 Tablet(s) By Mouth Every 12 Hours      budesonide-formoterol (SYMBICORT) 160-4.5 MCG/ACT inhaler Inhale 2 puffs into the lungs in the morning and at bedtime. 1 each 6    cetirizine (ZYRTEC) 10 MG tablet Take 10 mg by mouth daily.       chlorhexidine (PERIDEX) 0.12 % solution SMARTSIG:0.5 Ounce(s) By Mouth Morning-Night      clindamycin (CLEOCIN) 150 MG capsule Take 150 mg by mouth 4 (four) times daily.      hydrocortisone-pramoxine (PROCTOFOAM-HC) rectal foam Place 1 applicator rectally 2 (two) times daily. 10 g 1    ibuprofen (ADVIL) 800 MG tablet Take 1 tablet (800 mg total) by mouth every 6 (six) hours. 30 tablet 0    mupirocin ointment (BACTROBAN) 2 % SMARTSIG:Sparingly Topical 3 Times Daily      predniSONE (DELTASONE) 10 MG tablet 4 tabs for 2 days, then 3 tabs for 2 days, 2 tabs for 2 days, then 1 tab for 2 days, then stop 20 tablet 0    promethazine-dextromethorphan (PROMETHAZINE-DM) 6.25-15 MG/5ML syrup Take 5 mLs by mouth every 6 (six) hours as needed.       I have reviewed patient's Past Medical Hx, Surgical Hx, Family Hx, Social Hx, medications and allergies.   ROS:  Review of Systems  Constitutional:  Positive for fever.  HENT:  Positive for congestion, rhinorrhea and sore throat.   Respiratory:  Positive for cough, sputum production and shortness of breath. Negative for wheezing (had some earlier, relieved by inhaler).   Cardiovascular:  Negative for chest pain and leg swelling.  Gastrointestinal:  Negative for abdominal pain, diarrhea, nausea and vomiting.  Neurological:  Negative for headaches.   Other systems negative  Physical Exam  Patient Vitals for the past 24 hrs:  BP Temp Temp src Pulse Resp SpO2 Height Weight  04/23/22 2226 135/67 100.3 F (37.9 C) Oral (!) 121 (!) 26 97 % 5\' 7"  (1.702 m) 94.8 kg   Vitals:   04/23/22 2226 04/24/22 0010 04/24/22 0120  BP: 135/67  120/71  Pulse: (!) 121  (!) 104  Resp: (!) 26    Temp: 100.3 F (37.9 C) 100.1 F (37.8 C)   TempSrc: Oral Oral   SpO2: 97%    Weight: 94.8 kg    Height: 5\' 7"  (1.702 m)      Constitutional: Well-developed, well-nourished female in no acute distress.  Cardiovascular: normal rate and rhythm Respiratory: normal effort, clear  to auscultation bilaterally  No wheezes or crackles, Good breath sounds in bases.  Oxygen sats good  GI: Abd soft, non-tender, gravid appropriate for gestational age.   No rebound or guarding. MS: Extremities nontender, no edema, normal ROM Neurologic: Alert and oriented x 4.  GU: Neg CVAT.  FHT:  Baseline 150 , moderate variability, accelerations present, no decelerations Contractions:  occasional irritability   Labs: Results for orders placed or performed during the hospital encounter of 04/23/22 (from the past 24 hour(s))  Resp panel by RT-PCR (RSV, Flu A&B, Covid) Anterior Nasal Swab     Status: Abnormal   Collection Time: 04/23/22 10:19 PM   Specimen: Anterior Nasal Swab  Result Value Ref Range   SARS Coronavirus 2 by RT PCR NEGATIVE NEGATIVE   Influenza A by PCR NEGATIVE NEGATIVE   Influenza B by PCR POSITIVE (A) NEGATIVE   Resp Syncytial Virus by PCR NEGATIVE NEGATIVE  Urinalysis, Routine w reflex microscopic     Status: Abnormal   Collection Time: 04/23/22 10:37 PM  Result Value Ref Range   Color, Urine YELLOW YELLOW   APPearance HAZY (A) CLEAR   Specific Gravity, Urine 1.006 1.005 - 1.030   pH 7.0 5.0 - 8.0   Glucose, UA NEGATIVE NEGATIVE mg/dL   Hgb urine dipstick NEGATIVE NEGATIVE   Bilirubin Urine NEGATIVE NEGATIVE   Ketones, ur NEGATIVE NEGATIVE mg/dL   Protein, ur NEGATIVE NEGATIVE mg/dL   Nitrite NEGATIVE NEGATIVE   Leukocytes,Ua LARGE (A) NEGATIVE   RBC / HPF 0-5 0 - 5 RBC/hpf   WBC, UA 6-10 0 - 5 WBC/hpf   Bacteria, UA MANY (A) NONE SEEN   Squamous Epithelial / HPF 0-5 0 - 5 /HPF       Imaging:  No results found.  MAU Course/MDM: I have reviewed the triage vital signs and the nursing notes.   Pertinent labs & imaging results that were available during my care of the patient were reviewed by me and considered in my medical decision making (see chart for details).      I have reviewed her medical records including past results, notes and treatments.   I  have ordered labs and reviewed results. Positive for Flu B NST reviewed reassuring throughout with increased movement  Treatments in MAU included IV fluids given for hydration and to help get her and baby's heart rate down a bit, pt felt better after fluids .Patient wanted to take her Tylenol at home  Assessment: Single IUP at [redacted]w[redacted]d Influenza B Decreased fetal movement, likely related to illness  Plan: Discharge home Supportive care for Flu Tylenol for fever and pain Rx Mucinex for congestion Will use inhalers prn  Preterm Labor precautions and fetal kick counts Follow up in Office for prenatal visits and recheck Encouraged to return if she develops worsening of symptoms, increase in pain, fever, or other concerning symptoms.   Pt stable at time of discharge.  Hansel Feinstein CNM, MSN Certified Nurse-Midwife 04/23/2022 11:01 PM

## 2022-04-24 DIAGNOSIS — Z3A33 33 weeks gestation of pregnancy: Secondary | ICD-10-CM

## 2022-04-24 DIAGNOSIS — J101 Influenza due to other identified influenza virus with other respiratory manifestations: Secondary | ICD-10-CM | POA: Diagnosis not present

## 2022-04-24 DIAGNOSIS — O368130 Decreased fetal movements, third trimester, not applicable or unspecified: Secondary | ICD-10-CM | POA: Diagnosis not present

## 2022-04-24 DIAGNOSIS — R051 Acute cough: Secondary | ICD-10-CM

## 2022-04-24 DIAGNOSIS — J398 Other specified diseases of upper respiratory tract: Secondary | ICD-10-CM

## 2022-04-24 MED ORDER — GUAIFENESIN ER 600 MG PO TB12: 600.0000 mg | ORAL_TABLET | Freq: Two times a day (BID) | ORAL | 2 refills | Status: DC | PRN

## 2022-04-24 MED ORDER — ACETAMINOPHEN 500 MG PO TABS: 1000.0000 mg | ORAL_TABLET | Freq: Once | ORAL | Status: DC

## 2022-04-26 ENCOUNTER — Inpatient Hospital Stay (HOSPITAL_COMMUNITY): Payer: No Typology Code available for payment source

## 2022-04-26 ENCOUNTER — Other Ambulatory Visit: Payer: Self-pay

## 2022-04-26 ENCOUNTER — Inpatient Hospital Stay (HOSPITAL_COMMUNITY)
Admission: AD | Admit: 2022-04-26 | Discharge: 2022-04-26 | Disposition: A | Discharge status: Home / Self Care | Payer: No Typology Code available for payment source | Attending: Obstetrics and Gynecology | Admitting: Obstetrics and Gynecology

## 2022-04-26 ENCOUNTER — Encounter (HOSPITAL_COMMUNITY): Payer: Self-pay | Admitting: Obstetrics and Gynecology

## 2022-04-26 DIAGNOSIS — Z3A33 33 weeks gestation of pregnancy: Secondary | ICD-10-CM | POA: Diagnosis not present

## 2022-04-26 DIAGNOSIS — J101 Influenza due to other identified influenza virus with other respiratory manifestations: Secondary | ICD-10-CM | POA: Insufficient documentation

## 2022-04-26 DIAGNOSIS — J4540 Moderate persistent asthma, uncomplicated: Secondary | ICD-10-CM

## 2022-04-26 DIAGNOSIS — Z7951 Long term (current) use of inhaled steroids: Secondary | ICD-10-CM | POA: Diagnosis not present

## 2022-04-26 DIAGNOSIS — O99513 Diseases of the respiratory system complicating pregnancy, third trimester: Secondary | ICD-10-CM | POA: Diagnosis present

## 2022-04-26 DIAGNOSIS — R042 Hemoptysis: Secondary | ICD-10-CM | POA: Diagnosis not present

## 2022-04-26 DIAGNOSIS — R058 Other specified cough: Secondary | ICD-10-CM | POA: Diagnosis not present

## 2022-04-26 DIAGNOSIS — R0602 Shortness of breath: Secondary | ICD-10-CM | POA: Diagnosis not present

## 2022-04-26 LAB — CBC WITH DIFFERENTIAL/PLATELET
Abs Immature Granulocytes: 0.16 10*3/uL — ABNORMAL HIGH (ref 0.00–0.07)
Basophils Absolute: 0 10*3/uL (ref 0.0–0.1)
Basophils Relative: 0 %
Eosinophils Absolute: 0 10*3/uL (ref 0.0–0.5)
Eosinophils Relative: 0 %
HCT: 31.2 % — ABNORMAL LOW (ref 36.0–46.0)
Hemoglobin: 10 g/dL — ABNORMAL LOW (ref 12.0–15.0)
Immature Granulocytes: 2 %
Lymphocytes Relative: 11 %
Lymphs Abs: 1.2 10*3/uL (ref 0.7–4.0)
MCH: 24.8 pg — ABNORMAL LOW (ref 26.0–34.0)
MCHC: 32.1 g/dL (ref 30.0–36.0)
MCV: 77.4 fL — ABNORMAL LOW (ref 80.0–100.0)
Monocytes Absolute: 1 10*3/uL (ref 0.1–1.0)
Monocytes Relative: 10 %
Neutro Abs: 8.1 10*3/uL — ABNORMAL HIGH (ref 1.7–7.7)
Neutrophils Relative %: 77 %
Platelets: 203 10*3/uL (ref 150–400)
RBC: 4.03 MIL/uL (ref 3.87–5.11)
RDW: 17.9 % — ABNORMAL HIGH (ref 11.5–15.5)
WBC: 10.5 10*3/uL (ref 4.0–10.5)
nRBC: 0 % (ref 0.0–0.2)

## 2022-04-26 LAB — COMPREHENSIVE METABOLIC PANEL
ALT: 12 U/L (ref 0–44)
AST: 19 U/L (ref 15–41)
Albumin: 2.3 g/dL — ABNORMAL LOW (ref 3.5–5.0)
Alkaline Phosphatase: 140 U/L — ABNORMAL HIGH (ref 38–126)
Anion gap: 9 (ref 5–15)
BUN: <5 mg/dL — ABNORMAL LOW (ref 6–20)
CO2: 18 mmol/L — ABNORMAL LOW (ref 22–32)
Calcium: 7.8 mg/dL — ABNORMAL LOW (ref 8.9–10.3)
Chloride: 104 mmol/L (ref 98–111)
Creatinine, Ser: 0.6 mg/dL (ref 0.44–1.00)
GFR, Estimated: >60 mL/min (ref >60)
Glucose, Bld: 104 mg/dL — ABNORMAL HIGH (ref 70–99)
Potassium: 3.3 mmol/L — ABNORMAL LOW (ref 3.5–5.1)
Sodium: 131 mmol/L — ABNORMAL LOW (ref 135–145)
Total Bilirubin: <0.1 mg/dL — ABNORMAL LOW (ref 0.3–1.2)
Total Protein: 6.2 g/dL — ABNORMAL LOW (ref 6.5–8.1)

## 2022-04-26 LAB — URINALYSIS, ROUTINE W REFLEX MICROSCOPIC
Bilirubin Urine: NEGATIVE
Glucose, UA: NEGATIVE mg/dL
Hgb urine dipstick: NEGATIVE
Ketones, ur: 5 mg/dL — AB
Leukocytes,Ua: NEGATIVE
Nitrite: NEGATIVE
Protein, ur: NEGATIVE mg/dL
Specific Gravity, Urine: 1.012 (ref 1.005–1.030)
pH: 5 (ref 5.0–8.0)

## 2022-04-26 MED ORDER — BENZONATATE 100 MG PO CAPS
200.0000 mg | ORAL_CAPSULE | Freq: Once | ORAL | Status: AC
  Administered 2022-04-26: 200 mg via ORAL
  Filled 2022-04-26: qty 2

## 2022-04-26 MED ORDER — GUAIFENESIN 100 MG/5ML PO LIQD
15.0000 mL | Freq: Once | ORAL | Status: AC
  Administered 2022-04-26: 15 mL via ORAL
  Filled 2022-04-26: qty 15

## 2022-04-26 NOTE — MAU Provider Note (Signed)
History     [id number]  Arrival date and time: 04/26/22 1314    Chief Complaint  Patient presents with   Shortness of Breath   Cough   Hemoptysis     HPI Vickie Maldonado is a 32 y.o. at [redacted]w[redacted]d who presents for cough, shortness of breath, & hemoptysis.  Symptoms started last week, she was seen in MAU on Wednesday.  Diagnosed with flu B and started on Tamiflu.  Went to urgent care yesterday due to new onset hemoptysis and was started on amoxicillin for unknown reason.  History of asthma and lung scarring due to COVID. States she feels somewhat better than yesterday but that cough and shortness of breath worse.  Short of breath when talking and coughing or with too much activity.  Took Mucinex this morning.  Took maintenance inhaler this morning.  Has had 2 doses of albuterol today.  Has also had some bloody mucus in her sputum since yesterday.  No OB complaints.  Fever has been controlled with Tylenol.    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   Headache    Sinus tachycardia     Past Surgical History:  Procedure Laterality Date   CESAREAN SECTION N/A 06/04/2018   Procedure: CESAREAN SECTION;  Surgeon: [name], MD;  Location: [facility];  Service: Obstetrics;  Laterality: N/A;   WISDOM TOOTH EXTRACTION      Family History  Problem Relation Age of Onset   Hypertension Father    Breast cancer Maternal Aunt    Breast cancer Maternal Grandmother    Hypertension Paternal Grandfather    Hypertension Mother     Allergies  Allergen Reactions   Peanut-Containing Drug Products Anaphylaxis   Oxycodone Nausea And Vomiting   Latex Hives    No current facility-administered medications on file prior to encounter.   Current Outpatient  Medications on File Prior to Encounter  Medication Sig Dispense Refill   albuterol (VENTOLIN HFA) 108 (90 Base) MCG/ACT inhaler Inhale 1-2 puffs into the lungs every 6 (six) hours as needed for wheezing or shortness of breath. 8 g 1   BREZTRI AEROSPHERE 160-9-4.8 MCG/ACT AERO Inhale into the lungs.     budesonide-formoterol (SYMBICORT) 160-4.5 MCG/ACT inhaler Inhale 2 puffs into the lungs in the morning and at bedtime. 1 each 6   cetirizine (ZYRTEC) 10 MG tablet Take 10 mg by mouth daily.     guaiFENesin (MUCINEX) 600 MG 12 hr tablet Take 1 tablet (600 mg total) by mouth 2 (two) times daily as needed. 30 tablet 2     ROS Pertinent positives and negative per HPI, all others reviewed and negative  Physical Exam   BP 118/60   Pulse 88   Temp 99.1 F (37.3 C) (Axillary)   Resp (!) 21   Ht 5\' 7"  (1.702 m)   Wt 94.8 kg   SpO2 98%   BMI 32.75 kg/m   Patient Vitals for the past 24  hrs:  BP Temp Temp src Pulse Resp SpO2 Height Weight  04/26/22 1559 118/60 -- -- 88 -- -- -- --  04/26/22 1506 120/70 99.1 F (37.3 C) Axillary 86 (!) 21 -- -- --  04/26/22 1345 -- -- -- -- -- 98 % -- --  04/26/22 1343 119/66 -- -- (!) 116 -- -- -- --  04/26/22 1325 122/74 98.2 F (36.8 C) Oral (!) 109 (!) 24 99 % -- --  04/26/22 1321 -- -- -- -- -- -- 5\' 7"  (1.702 m) 94.8 kg    Physical Exam Vitals and nursing note reviewed.  Constitutional:      General: She is not in acute distress.    Appearance: She is well-developed.  HENT:     Head: Normocephalic and atraumatic.  Eyes:     Extraocular Movements: Extraocular movements intact.     Pupils: Pupils are equal, round, and reactive to light.  Cardiovascular:     Rate and Rhythm: Regular rhythm. Tachycardia present.     Heart sounds: Normal heart sounds.  Pulmonary:     Effort: Pulmonary effort is normal. Tachypnea present. No accessory muscle usage or respiratory distress.     Breath sounds: Examination of the right-upper field reveals wheezing.  Wheezing present. No decreased breath sounds.  Skin:    General: Skin is warm and dry.  Neurological:     Mental Status: She is alert.  Psychiatric:        Mood and Affect: Mood normal.        Behavior: Behavior normal.       FHT Baseline 145, moderate variability, 15x15 accels, no decels Toco: irregular Cat: 1  Labs Results for orders placed or performed during the hospital encounter of 04/26/22 (from the past 24 hour(s))  Urinalysis, Routine w reflex microscopic Urine, Clean Catch     Status: Abnormal   Collection Time: 04/26/22  1:30 PM  Result Value Ref Range   Color, Urine YELLOW YELLOW   APPearance CLEAR CLEAR   Specific Gravity, Urine 1.012 1.005 - 1.030   pH 5.0 5.0 - 8.0   Glucose, UA NEGATIVE NEGATIVE mg/dL   Hgb urine dipstick NEGATIVE NEGATIVE   Bilirubin Urine NEGATIVE NEGATIVE   Ketones, ur 5 (A) NEGATIVE mg/dL   Protein, ur NEGATIVE NEGATIVE mg/dL   Nitrite NEGATIVE NEGATIVE   Leukocytes,Ua NEGATIVE NEGATIVE  CBC with Differential/Platelet     Status: Abnormal   Collection Time: 04/26/22  2:30 PM  Result Value Ref Range   WBC 10.5 4.0 - 10.5 K/uL   RBC 4.03 3.87 - 5.11 MIL/uL   Hemoglobin 10.0 (L) 12.0 - 15.0 g/dL   HCT 31.2 (L) 36.0 - 46.0 %   MCV 77.4 (L) 80.0 - 100.0 fL   MCH 24.8 (L) 26.0 - 34.0 pg   MCHC 32.1 30.0 - 36.0 g/dL   RDW 17.9 (H) 11.5 - 15.5 %   Platelets 203 150 - 400 K/uL   nRBC 0.0 0.0 - 0.2 %   Neutrophils Relative % 77 %   Neutro Abs 8.1 (H) 1.7 - 7.7 K/uL   Lymphocytes Relative 11 %   Lymphs Abs 1.2 0.7 - 4.0 K/uL   Monocytes Relative 10 %   Monocytes Absolute 1.0 0.1 - 1.0 K/uL   Eosinophils Relative 0 %   Eosinophils Absolute 0.0 0.0 - 0.5 K/uL   Basophils Relative 0 %   Basophils Absolute 0.0 0.0 - 0.1 K/uL   Immature Granulocytes 2 %   Abs Immature Granulocytes  0.16 (H) 0.00 - 0.07 K/uL  Comprehensive metabolic panel     Status: Abnormal   Collection Time: 04/26/22  2:30 PM  Result Value Ref Range   Sodium 131 (L)  135 - 145 mmol/L   Potassium 3.3 (L) 3.5 - 5.1 mmol/L   Chloride 104 98 - 111 mmol/L   CO2 18 (L) 22 - 32 mmol/L   Glucose, Bld 104 (H) 70 - 99 mg/dL   BUN <5 (L) 6 - 20 mg/dL   Creatinine, Ser 1.75 0.44 - 1.00 mg/dL   Calcium 7.8 (L) 8.9 - 10.3 mg/dL   Total Protein 6.2 (L) 6.5 - 8.1 g/dL   Albumin 2.3 (L) 3.5 - 5.0 g/dL   AST 19 15 - 41 U/L   ALT 12 0 - 44 U/L   Alkaline Phosphatase 140 (H) 38 - 126 U/L   Total Bilirubin <0.1 (L) 0.3 - 1.2 mg/dL   GFR, Estimated >10 >25 mL/min   Anion gap 9 5 - 15    Imaging DG CHEST PORT 1 VIEW  Result Date: 04/26/2022 CLINICAL DATA:  10026 Shortness of breath 10026 EXAM: PORTABLE CHEST - 1 VIEW COMPARISON:  05/20/2021 FINDINGS: Cardiac silhouette is unremarkable. No pneumothorax or pleural effusion. The lungs are clear. The visualized skeletal structures are unremarkable. IMPRESSION: No acute cardiopulmonary process. Electronically Signed   By: Layla Maw M.D.   On: 04/26/2022 14:13    MAU Course  Procedures Lab Orders         Urinalysis, Routine w reflex microscopic Urine, Clean Catch         CBC with Differential/Platelet         Comprehensive metabolic panel    Meds ordered this encounter  Medications   benzonatate (TESSALON) capsule 200 mg   guaiFENesin (ROBITUSSIN) 100 MG/5ML liquid 15 mL   Imaging Orders         DG CHEST PORT 1 VIEW     MDM Patient diagnosed with influenza B on Wednesday. Presents for hemoptysis & continued cough/sob. Lung sounds mostly clear throughout. Vital signs improved while in MAU.   Chest xray normal & no leukocytosis.   Chart reviewed by Dr. Earlene Plater. Patient stable for discharge home.  Assessment and Plan   1. Influenza B  -continue tamiflu -Discussed OTC meds safe in pregnancy -D/c amoxicillin  2. Moderate persistent asthma without complication  -Continue albuterol on schedule for next 48 hours  3. [redacted] weeks gestation of pregnancy  -Keep f/u with Dr. Billy Coast on Wednesday     Judeth Horn,  NP 04/26/22 4:26 PM

## 2022-04-26 NOTE — MAU Note (Signed)
Vickie Maldonado is a 32 y.o. at [redacted]w[redacted]d here in MAU reporting: + for Flu B. Is taking Tamiflu. Yesterday started coughing up bloody mucus. Is feeling SOB and chest pain. Went to UC yesterday and was started on amoxicillin. Has been taking tylenol for fever, last dose was around 10. No abdominal pain, bleeding, or LOF. +FM  Onset of complaint: ongoing  Pain score: 0/10  Vitals:   04/26/22 1325  BP: 122/74  Pulse: (!) 109  Resp: (!) 24  Temp: 98.2 F (36.8 C)  SpO2: 99%     FHT:157  Lab orders placed from triage: UA

## 2022-04-26 NOTE — Discharge Instructions (Signed)

## 2022-05-09 LAB — OB RESULTS CONSOLE GBS: GBS: NEGATIVE

## 2022-06-06 ENCOUNTER — Telehealth (HOSPITAL_COMMUNITY): Payer: Self-pay | Admitting: *Deleted

## 2022-06-06 ENCOUNTER — Other Ambulatory Visit: Payer: Self-pay | Admitting: Obstetrics and Gynecology

## 2022-06-06 NOTE — Telephone Encounter (Signed)
Preadmission screen  

## 2022-06-07 ENCOUNTER — Other Ambulatory Visit: Payer: Self-pay

## 2022-06-07 ENCOUNTER — Encounter (HOSPITAL_COMMUNITY): Payer: Self-pay | Admitting: Obstetrics and Gynecology

## 2022-06-07 ENCOUNTER — Inpatient Hospital Stay (HOSPITAL_COMMUNITY)
Admission: AD | Admit: 2022-06-07 | Discharge: 2022-06-08 | DRG: 807 | Disposition: A | Discharge status: Home / Self Care | Payer: No Typology Code available for payment source | Attending: Obstetrics and Gynecology | Admitting: Obstetrics and Gynecology

## 2022-06-07 DIAGNOSIS — O9902 Anemia complicating childbirth: Secondary | ICD-10-CM | POA: Diagnosis present

## 2022-06-07 DIAGNOSIS — Z91040 Latex allergy status: Secondary | ICD-10-CM | POA: Diagnosis not present

## 2022-06-07 DIAGNOSIS — O99820 Streptococcus B carrier state complicating pregnancy: Secondary | ICD-10-CM | POA: Diagnosis not present

## 2022-06-07 DIAGNOSIS — Z3A39 39 weeks gestation of pregnancy: Secondary | ICD-10-CM

## 2022-06-07 DIAGNOSIS — O99824 Streptococcus B carrier state complicating childbirth: Secondary | ICD-10-CM | POA: Diagnosis present

## 2022-06-07 DIAGNOSIS — O34219 Maternal care for unspecified type scar from previous cesarean delivery: Secondary | ICD-10-CM | POA: Diagnosis present

## 2022-06-07 DIAGNOSIS — O26893 Other specified pregnancy related conditions, third trimester: Secondary | ICD-10-CM | POA: Diagnosis present

## 2022-06-07 HISTORY — DX: Unspecified asthma, uncomplicated: J45.909

## 2022-06-07 LAB — CBC
HCT: 36.8 % (ref 36.0–46.0)
Hemoglobin: 11.9 g/dL — ABNORMAL LOW (ref 12.0–15.0)
MCH: 25.2 pg — ABNORMAL LOW (ref 26.0–34.0)
MCHC: 32.3 g/dL (ref 30.0–36.0)
MCV: 78 fL — ABNORMAL LOW (ref 80.0–100.0)
Platelets: 216 10*3/uL (ref 150–400)
RBC: 4.72 MIL/uL (ref 3.87–5.11)
RDW: 19.5 % — ABNORMAL HIGH (ref 11.5–15.5)
WBC: 14.7 10*3/uL — ABNORMAL HIGH (ref 4.0–10.5)
nRBC: 0 % (ref 0.0–0.2)

## 2022-06-07 LAB — RPR: RPR Ser Ql: NONREACTIVE

## 2022-06-07 LAB — TYPE AND SCREEN
ABO/RH(D): O POS
Antibody Screen: NEGATIVE

## 2022-06-07 LAB — HIV ANTIBODY (ROUTINE TESTING W REFLEX): HIV Screen 4th Generation wRfx: NONREACTIVE

## 2022-06-07 MED ORDER — ACETAMINOPHEN 325 MG PO TABS: 650.0000 mg | ORAL_TABLET | ORAL | Status: DC | PRN

## 2022-06-07 MED ORDER — DIPHENHYDRAMINE HCL 25 MG PO CAPS: 25.0000 mg | ORAL_CAPSULE | Freq: Four times a day (QID) | ORAL | Status: DC | PRN

## 2022-06-07 MED ORDER — DOCUSATE SODIUM 100 MG PO CAPS
100.0000 mg | ORAL_CAPSULE | Freq: Two times a day (BID) | ORAL | Status: DC
  Administered 2022-06-07 – 2022-06-08 (×2): 100 mg via ORAL
  Filled 2022-06-07 (×2): qty 1

## 2022-06-07 MED ORDER — ONDANSETRON HCL 4 MG PO TABS: 4.0000 mg | ORAL_TABLET | ORAL | Status: DC | PRN

## 2022-06-07 MED ORDER — PRENATAL MULTIVITAMIN CH
1.0000 | ORAL_TABLET | Freq: Every day | ORAL | Status: DC
  Filled 2022-06-07: qty 1

## 2022-06-07 MED ORDER — IBUPROFEN 600 MG PO TABS
600.0000 mg | ORAL_TABLET | Freq: Four times a day (QID) | ORAL | Status: DC
  Administered 2022-06-07 – 2022-06-08 (×5): 600 mg via ORAL
  Filled 2022-06-07 (×5): qty 1

## 2022-06-07 MED ORDER — OXYTOCIN BOLUS FROM INFUSION
333.0000 mL | Freq: Once | INTRAVENOUS | Status: AC
  Administered 2022-06-07: 333 mL via INTRAVENOUS

## 2022-06-07 MED ORDER — OXYCODONE-ACETAMINOPHEN 5-325 MG PO TABS: 1.0000 | ORAL_TABLET | ORAL | Status: DC | PRN

## 2022-06-07 MED ORDER — SENNOSIDES-DOCUSATE SODIUM 8.6-50 MG PO TABS
2.0000 | ORAL_TABLET | Freq: Every day | ORAL | Status: DC
  Administered 2022-06-08: 2 via ORAL
  Filled 2022-06-07: qty 2

## 2022-06-07 MED ORDER — MOMETASONE FURO-FORMOTEROL FUM 200-5 MCG/ACT IN AERO
2.0000 | INHALATION_SPRAY | Freq: Two times a day (BID) | RESPIRATORY_TRACT | Status: DC
  Filled 2022-06-07: qty 8.8

## 2022-06-07 MED ORDER — DIBUCAINE (PERIANAL) 1 % EX OINT: 1.0000 | TOPICAL_OINTMENT | CUTANEOUS | Status: DC | PRN

## 2022-06-07 MED ORDER — BENZOCAINE-MENTHOL 20-0.5 % EX AERO
1.0000 | INHALATION_SPRAY | CUTANEOUS | Status: DC | PRN
  Administered 2022-06-07: 1 via TOPICAL
  Filled 2022-06-07: qty 56

## 2022-06-07 MED ORDER — LIDOCAINE HCL (PF) 1 % IJ SOLN
30.0000 mL | INTRAMUSCULAR | Status: AC | PRN
  Administered 2022-06-07: 30 mL via SUBCUTANEOUS
  Filled 2022-06-07: qty 30

## 2022-06-07 MED ORDER — LACTATED RINGERS IV SOLN: INTRAVENOUS | Status: DC

## 2022-06-07 MED ORDER — ALBUTEROL SULFATE (2.5 MG/3ML) 0.083% IN NEBU: 3.0000 mL | INHALATION_SOLUTION | Freq: Four times a day (QID) | RESPIRATORY_TRACT | Status: DC | PRN

## 2022-06-07 MED ORDER — COCONUT OIL OIL: 1.0000 | TOPICAL_OIL | Status: DC | PRN

## 2022-06-07 MED ORDER — FENTANYL CITRATE (PF) 100 MCG/2ML IJ SOLN: 100.0000 ug | Freq: Once | INTRAMUSCULAR | Status: AC

## 2022-06-07 MED ORDER — ONDANSETRON HCL 4 MG/2ML IJ SOLN: 4.0000 mg | INTRAMUSCULAR | Status: DC | PRN

## 2022-06-07 MED ORDER — OXYCODONE-ACETAMINOPHEN 5-325 MG PO TABS: 2.0000 | ORAL_TABLET | ORAL | Status: DC | PRN

## 2022-06-07 MED ORDER — FENTANYL CITRATE (PF) 100 MCG/2ML IJ SOLN
INTRAMUSCULAR | Status: AC
  Administered 2022-06-07: 100 ug via INTRAVENOUS
  Filled 2022-06-07: qty 2

## 2022-06-07 MED ORDER — LACTATED RINGERS IV SOLN: 500.0000 mL | INTRAVENOUS | Status: DC | PRN

## 2022-06-07 MED ORDER — TETANUS-DIPHTH-ACELL PERTUSSIS 5-2.5-18.5 LF-MCG/0.5 IM SUSY: 0.5000 mL | PREFILLED_SYRINGE | Freq: Once | INTRAMUSCULAR | Status: DC

## 2022-06-07 MED ORDER — ONDANSETRON HCL 4 MG/2ML IJ SOLN
4.0000 mg | Freq: Four times a day (QID) | INTRAMUSCULAR | Status: DC | PRN
  Administered 2022-06-07: 4 mg via INTRAVENOUS
  Filled 2022-06-07: qty 2

## 2022-06-07 MED ORDER — SOD CITRATE-CITRIC ACID 500-334 MG/5ML PO SOLN: 30.0000 mL | ORAL | Status: DC | PRN

## 2022-06-07 MED ORDER — SIMETHICONE 80 MG PO CHEW: 80.0000 mg | CHEWABLE_TABLET | ORAL | Status: DC | PRN

## 2022-06-07 MED ORDER — OXYTOCIN-SODIUM CHLORIDE 30-0.9 UT/500ML-% IV SOLN
2.5000 [IU]/h | INTRAVENOUS | Status: DC
  Administered 2022-06-07: 2.5 [IU]/h via INTRAVENOUS
  Filled 2022-06-07: qty 500

## 2022-06-07 MED ORDER — FLEET ENEMA 7-19 GM/118ML RE ENEM: 1.0000 | ENEMA | RECTAL | Status: DC | PRN

## 2022-06-07 MED ORDER — WITCH HAZEL-GLYCERIN EX PADS
1.0000 | MEDICATED_PAD | CUTANEOUS | Status: DC | PRN
  Administered 2022-06-07: 1 via TOPICAL

## 2022-06-07 NOTE — H&P (Signed)
Vickie Maldonado is a 32 y.o. G3P2002 at 31w2dgestation presents for complaint of  labor.  Noted to be 5cm in MAU (had beeen 2cm last office check).   I was notified that the patient was 8cm/-1 at 7:18am and then received a call a few minutes later that the patient was complete and faculty in room. I was enroute.  I arrived just after delivery of placenta.   Antepartum course:  h/o c/s for breech, h/o successful vbac with 3rd degree; s>d, aga growth   [facility]  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 Social History:  reports that she has never smoked. She has never used smokeless tobacco. She reports that she does not currently use alcohol. She reports that she does not use drugs.  ROS: See above otherwise negative  Prenatal labs:  ABO, Rh: --/--/O POS (02/17 0357) Antibody: NEG (02/17 0357) Rubella: Immune (07/31 0000) RPR: NON REACTIVE (02/17 0357)  HBsAg: Negative (07/31 0000)  HIV:Non-reactive (07/31 0000)  GBS: Negative/-- (01/19 0000)  1 hr Glucola: Normal Genetic screening: Normal Anatomy [place] Normal  Physical Exam:  A&O x 3 HEENT: Normal Lungs: CTAB CV: RRR Abdominal: Soft and Non-tender Lower Extremities:  Non-edematous, Non-tender      Labs:  CBC:  Lab Results  Component Value Date   WBC 14.7 (H) 06/07/2022   RBC 4.72 06/07/2022   HGB 11.9 (L) 06/07/2022   HCT 36.8 06/07/2022   MCV 78.0 (L) 06/07/2022   MCH 25.2 (L) 06/07/2022   MCHC 32.3 06/07/2022   RDW 19.5 (H) 06/07/2022   PLT 216 06/07/2022   CMP:  Lab Results  Component Value Date   NA 131 (L) 04/26/2022   K 3.3 (L) 04/26/2022   CL 104 04/26/2022   CO2 18 (L) 04/26/2022   GLUCOSE 104 (H) 04/26/2022   BUN <5 (L) 04/26/2022   CREATININE 0.60 04/26/2022   CALCIUM 7.8 (L) 04/26/2022   PROT 6.2 (L) 04/26/2022   AST 19 04/26/2022   ALT 12 04/26/2022   ALBUMIN 2.3 (L) 04/26/2022   ALKPHOS 140 (H) 04/26/2022   BILITOT <0.1 (L) 04/26/2022   GFRNONAA >60 04/26/2022   ANIONGAP 9 04/26/2022   Urine: Lab Results  Component Value Date   COLORURINE YELLOW 04/26/2022   APPEARANCEUR CLEAR 04/26/2022   LABSPEC 1.012 04/26/2022   PHURINE 5.0 04/26/2022   GLUCOSEU NEGATIVE 04/26/2022   HGBUR NEGATIVE 04/26/2022   BILIRUBINUR NEGATIVE  04/26/2022   KETONESUR 5 (A) 04/26/2022   PROTEINUR NEGATIVE 04/26/2022   NITRITE NEGATIVE 04/26/2022   LEUKOCYTESUR NEGATIVE 04/26/2022     Prenatal Transfer Tool  Maternal Diabetes: No Genetic Screening: Normal Maternal Ultrasounds/Referrals: Normal Fetal Ultrasounds or other Referrals:  None Maternal Substance Abuse:  No Significant Maternal Medications:  None Significant Maternal Lab Results: Group B Strep positive Number of Prenatal Visits:greater than 3 verified prenatal visits Other Comments:  None  Fht: were 130s, nml variability, cat 1  Assessment/Plan:  59 y.o. G3P2002 at 75w2dgestation   Active labor - admitted for vbac, h/o successful vbac 2 yrs ago, (risks discussed with primary provider and accepts including risk of uterine rupture); precipitous delivery; please see delivery note of Dr AJanus Molder I completed the repair. Gbs neg RH positive RI   SCharyl Bigger2/17/2024, 8:48 AM

## 2022-06-07 NOTE — MAU Note (Signed)
..  Vickie Maldonado is a 32 y.o. at 56w2dhere in MAU reporting: contractions since 1:40am that are now every 2-3 minutes. Denies vaginal bleeding or leaking of fluid but states her underwear have been more wet than normal. +FM

## 2022-06-08 LAB — CBC
HCT: 33 % — ABNORMAL LOW (ref 36.0–46.0)
Hemoglobin: 10.3 g/dL — ABNORMAL LOW (ref 12.0–15.0)
MCH: 25 pg — ABNORMAL LOW (ref 26.0–34.0)
MCHC: 31.2 g/dL (ref 30.0–36.0)
MCV: 80.1 fL (ref 80.0–100.0)
Platelets: 222 10*3/uL (ref 150–400)
RBC: 4.12 MIL/uL (ref 3.87–5.11)
RDW: 19.7 % — ABNORMAL HIGH (ref 11.5–15.5)
WBC: 13.4 10*3/uL — ABNORMAL HIGH (ref 4.0–10.5)
nRBC: 0 % (ref 0.0–0.2)

## 2022-06-08 MED ORDER — IBUPROFEN 600 MG PO TABS: 600.0000 mg | ORAL_TABLET | Freq: Four times a day (QID) | ORAL | 0 refills | Status: DC

## 2022-06-08 NOTE — Progress Notes (Signed)
CSW received consult for hx of Anxiety and Depression. CSW met with MOB to offer support and complete assessment. When CSW entered room, FOB and a visitor were present. CSW introduced self and requested to speak with MOB alone. MOB provided verbal consent to complete consult with visitors present. MOB presented as calm and remained engaged during encounter.   CSW inquired how MOB has felt emotionally since infant's delivery. MOB states she has felt "great." CSW inquired about MOB's mental health history. MOB states she was first diagnosed with anxiety "years ago", noting anxiety during pregnancy marked by feeling overwhelmed. MOB shared that endorsing feelings of anxiety are her baseline. MOB states she is not current with a therapist and does not take psychotropic medication. MOB declined outpatient mental health resources at this time, stating she feels comfortable contacting her OBGYN if concerns arise. MOB states she experienced postpartum depression (PPD) after her first child (MOB has 3 children including infant), marked by feelings of dread and states she endorsed thoughts of harming herself. MOB denies a history of suicide attempts. MOB states she began taking Zoloft as treatment for postpartum depression and took the medication for 1 year. MOB recalls that the medication was not very helpful and states that time helped her mood improve. MOB added that she experienced PPD at the same time that the Covid-19 pandemic began, which also isolated her from supports and contributed towards feelings of anxiety and depression at the time. CSW validated MOB's emotional experience. MOB identified her sisters, FOB, and family members as supports. MOB denied current SI/HI.   CSW provided education regarding the baby blues period vs. perinatal mood disorders, discussed treatment and gave resources for mental health follow up if concerns arise.  CSW recommends self-evaluation during the postpartum time period using the  New Mom Checklist from Postpartum Progress and encouraged MOB to contact a medical professional if symptoms are noted at any time.    MOB reports she has all needed items for infant, including a car seat and bassinet.   MOB declined review of Sudden Infant Death Syndrome (SIDS) precautions, sharing she is aware of safe sleep precautions. MOB declined additional resource support.  CSW identifies no further need for intervention and no barriers to discharge at this time.  Signed,  Berniece Salines, MSW, [place], LCASA 06/08/2022 4:01 PM

## 2022-06-08 NOTE — Discharge Summary (Signed)
Postpartum Discharge Summary  Date of Service updated       Patient Name: Vickie Maldonado DOB: 11-Jun-1990 MRN: [id number]     Date of admission: 06/07/2022 Delivery date:06/07/2022  Delivering provider: Gerlene Fee  Date of discharge: 06/08/2022  Admitting diagnosis: Normal labor and delivery [O80] Intrauterine pregnancy: [redacted]w[redacted]d    Secondary diagnosis:  Principal Problem:   Normal labor and delivery Precipitous delivery Additional problems: none    Discharge diagnosis: Term Pregnancy Delivered and VBAC                                              Post partum procedures: none  Complications: none  Hospital course: Onset of Labor With Vaginal Delivery      32y.o. yo G3P3003 at 357w2das admitted in Active Labor on 06/07/2022, then had precipitous delivery.  Membrane Rupture Time/Date: 7:41 AM ,06/07/2022   Delivery Method:Vaginal, Spontaneous  Episiotomy: None  Lacerations:  2nd degree  Patient had a postpartum course complicated by .  She is ambulating, tolerating a regular diet, passing flatus, and urinating well. Patient is discharged home in stable condition on 06/08/22.  Newborn Data: Birth date:06/07/2022  Birth time:7:41 AM  Gender:Female  Living status:Living  Apgars:8 ,9  Weight:3540 g    Physical exam  Vitals:   06/07/22 1545 06/07/22 1945 06/08/22 0001 06/08/22 0540  BP: 110/71 128/74 119/78 104/60  Pulse: 75 82 70 (!) 52  Resp: 18 16 16 16  $ Temp: 98.4 F (36.9 C) 97.8 F (36.6 C) 97.6 F (36.4 C) 98.2 F (36.8 C)  TempSrc: Oral Oral Oral Oral  SpO2: 98% 100% 98% 98%  Weight:      Height:       General: alert, cooperative, and no distress Lochia: appropriate Uterine Fundus: firm DVT Evaluation: No evidence of DVT seen on physical exam. Labs: Lab Results  Component Value Date   WBC 13.4 (H) 06/08/2022   HGB 10.3 (L) 06/08/2022   HCT 33.0 (L) 06/08/2022   MCV 80.1 06/08/2022   PLT 222 06/08/2022      Latest Ref Rng & Units 04/26/2022     2:30 PM  CMP  Glucose 70 - 99 mg/dL 104   BUN 6 - 20 mg/dL <5   Creatinine 0.44 - 1.00 mg/dL 0.60   Sodium 135 - 145 mmol/L 131   Potassium 3.5 - 5.1 mmol/L 3.3   Chloride 98 - 111 mmol/L 104   CO2 22 - 32 mmol/L 18   Calcium 8.9 - 10.3 mg/dL 7.8   Total Protein 6.5 - 8.1 g/dL 6.2   Total Bilirubin 0.3 - 1.2 mg/dL <0.1   Alkaline Phos 38 - 126 U/L 140   AST 15 - 41 U/L 19   ALT 0 - 44 U/L 12    Edinburgh Score:    06/08/2022    5:40 AM  Edinburgh Postnatal Depression Scale Screening Tool  I have been able to laugh and see the funny side of things. 0  I have looked forward with enjoyment to things. 0  I have blamed myself unnecessarily when things went wrong. 2  I have been anxious or worried for no good reason. 2  I have felt scared or panicky for no good reason. 2  Things have been getting on top of me. 2  I have been so unhappy that  I have had difficulty sleeping. 0  I have felt sad or miserable. 0  I have been so unhappy that I have been crying. 0  The thought of harming myself has occurred to me. 0  Edinburgh Postnatal Depression Scale Total 8      After visit meds:  Allergies as of 06/08/2022       Reactions   Oxycodone Nausea And Vomiting   Latex Hives        Medication List     STOP taking these medications    budesonide-formoterol 160-4.5 MCG/ACT inhaler Commonly known as: Symbicort       TAKE these medications    albuterol 108 (90 Base) MCG/ACT inhaler Commonly known as: VENTOLIN HFA Inhale 1-2 puffs into the lungs every 6 (six) hours as needed for wheezing or shortness of breath. What changed:  how much to take when to take this   Breztri Aerosphere 160-9-4.8 MCG/ACT Aero Generic drug: Budeson-Glycopyrrol-Formoterol Inhale 2 puffs into the lungs daily.   cetirizine 10 MG tablet Commonly known as: ZYRTEC Take 10 mg by mouth daily.   ferrous sulfate 325 (65 FE) MG EC tablet Take 325 mg by mouth daily.   ibuprofen 600 MG  tablet Commonly known as: ADVIL Take 1 tablet (600 mg total) by mouth every 6 (six) hours.   TUMS PO Take 1 tablet by mouth 4 (four) times daily as needed (heartburn).         Discharge home in stable condition Infant Feeding: Breast Infant Disposition:home with mother Discharge instruction: per After Visit Summary and Postpartum booklet. Activity: Advance as tolerated. Pelvic rest for 6 weeks.  Diet: routine diet Anticipated Birth Control: Unsure Postpartum Appointment:6 weeks Additional Postpartum F/U:  n/a Future Appointments:No future appointments. Follow up Visit:      06/08/2022 Charyl Bigger, MD

## 2022-06-08 NOTE — Progress Notes (Signed)
No c/o Pain controlled, normal lochia, voids w/o difficulty Breastfeeding Wants d/c home  Patient Vitals for the past 24 hrs:  BP Temp Temp src Pulse Resp SpO2  06/08/22 0540 104/60 98.2 F (36.8 C) Oral (!) 52 16 98 %  06/08/22 0001 119/78 97.6 F (36.4 C) Oral 70 16 98 %  06/07/22 1945 128/74 97.8 F (36.6 C) Oral 82 16 100 %  06/07/22 1545 110/71 98.4 F (36.9 C) Oral 75 18 98 %  06/07/22 1045 132/79 -- Oral 68 18 99 %  06/07/22 0942 135/77 98.1 F (36.7 C) Oral 70 18 99 %   A&ox3 Nml respirations Abd: soft,nt,nd, fundus firm and below umb LE: no edema, nt bilat     Latest Ref Rng & Units 06/08/2022    5:14 AM 06/07/2022    3:57 AM 04/26/2022    2:30 PM  CBC  WBC 4.0 - 10.5 K/uL 13.4  14.7  10.5   Hemoglobin 12.0 - 15.0 g/dL 10.3  11.9  10.0   Hematocrit 36.0 - 46.0 % 33.0  36.8  31.2   Platelets 150 - 400 K/uL 222  216  203    A/P: ppd1 s/p svd Doing well - wants d/c home; has f/u planned 6 wk Anemia of pregnancy, acute blood loss - asymptomatic Rh pos RI Breastfeeding, boy-plan circ this morning; risks/benefits of circ discussed with pt, understands optional procedure; pt to sign consent

## 2022-06-08 NOTE — Lactation Note (Signed)
This note was copied from a baby's chart. Lactation Consultation Note  Patient Name: Vickie Maldonado [id number] Date: 06/08/2022   Age:31 hours  Per RN [name]) Birth Parent is doing well in breastfeeding and would like to be seen in the morning by [facility] services. .  Maternal Data    Feeding    LATCH Score                    Lactation Tools Discussed/Used    Interventions    Discharge    Consult Status      [name] 06/08/2022, 12:13 AM

## 2022-06-08 NOTE — Lactation Note (Signed)
This note was copied from a baby's chart. Lactation Consultation Note  Patient Name: Vickie Maldonado [id number] Date: 06/08/2022   Age:30 hours P3, 39.2 GA  Mother reports baby is breastfeeding well. She denies any questions or concerns. Infant has exclusively breast fed.  Mother has OP LC handout regarding [place] lactation support and services post discharge.

## 2022-06-08 NOTE — Progress Notes (Addendum)
CSW acknowledges social work consult placed due to history of anxiety. CSW attempted to meet with MOB at bedside; however MOB was asleep. CSW to follow up at a later time.  Signed,  Berniece Salines, MSW, LCSWA, LCASA 06/08/2022 11:15 AM

## 2022-06-10 ENCOUNTER — Inpatient Hospital Stay (HOSPITAL_COMMUNITY)
Admission: RE | Admit: 2022-06-10 | Payer: No Typology Code available for payment source | Source: Home / Self Care | Admitting: Obstetrics and Gynecology

## 2022-06-10 ENCOUNTER — Inpatient Hospital Stay (HOSPITAL_COMMUNITY): Payer: No Typology Code available for payment source

## 2022-06-18 ENCOUNTER — Telehealth (HOSPITAL_COMMUNITY): Payer: Self-pay | Admitting: *Deleted

## 2022-06-18 NOTE — Telephone Encounter (Signed)
Left phone voicemail message.  Odis Hollingshead, RN 06-18-2022 at 3:01pm

## 2022-07-18 ENCOUNTER — Telehealth (HOSPITAL_COMMUNITY): Payer: Self-pay

## 2022-07-18 NOTE — Telephone Encounter (Signed)
Chart review.

## 2022-07-28 LAB — HM PAP SMEAR: HM Pap smear: NEGATIVE

## 2023-01-01 ENCOUNTER — Encounter: Payer: Self-pay | Admitting: Physician Assistant

## 2023-01-01 ENCOUNTER — Ambulatory Visit (INDEPENDENT_AMBULATORY_CARE_PROVIDER_SITE_OTHER): Payer: No Typology Code available for payment source | Admitting: Physician Assistant

## 2023-01-01 VITALS — BP 120/74 | HR 76 | Temp 97.1°F | Ht 67.0 in | Wt 209.2 lb

## 2023-01-01 DIAGNOSIS — R5383 Other fatigue: Secondary | ICD-10-CM | POA: Diagnosis not present

## 2023-01-01 DIAGNOSIS — J4540 Moderate persistent asthma, uncomplicated: Secondary | ICD-10-CM | POA: Diagnosis not present

## 2023-01-01 DIAGNOSIS — T43225A Adverse effect of selective serotonin reuptake inhibitors, initial encounter: Secondary | ICD-10-CM

## 2023-01-01 DIAGNOSIS — Z8349 Family history of other endocrine, nutritional and metabolic diseases: Secondary | ICD-10-CM

## 2023-01-01 DIAGNOSIS — R0609 Other forms of dyspnea: Secondary | ICD-10-CM | POA: Diagnosis not present

## 2023-01-01 DIAGNOSIS — F419 Anxiety disorder, unspecified: Secondary | ICD-10-CM

## 2023-01-01 DIAGNOSIS — U099 Post covid-19 condition, unspecified: Secondary | ICD-10-CM

## 2023-01-01 LAB — CBC WITH DIFFERENTIAL/PLATELET
Basophils Absolute: 0.1 10*3/uL (ref 0.0–0.1)
Basophils Relative: 0.9 % (ref 0.0–3.0)
Eosinophils Absolute: 0.3 10*3/uL (ref 0.0–0.7)
Eosinophils Relative: 3.1 % (ref 0.0–5.0)
HCT: 40.7 % (ref 36.0–46.0)
Hemoglobin: 13 g/dL (ref 12.0–15.0)
Lymphocytes Relative: 32.9 % (ref 12.0–46.0)
Lymphs Abs: 2.7 10*3/uL (ref 0.7–4.0)
MCHC: 31.9 g/dL (ref 30.0–36.0)
MCV: 76.8 fl — ABNORMAL LOW (ref 78.0–100.0)
Monocytes Absolute: 0.5 10*3/uL (ref 0.1–1.0)
Monocytes Relative: 6.7 % (ref 3.0–12.0)
Neutro Abs: 4.6 10*3/uL (ref 1.4–7.7)
Neutrophils Relative %: 56.4 % (ref 43.0–77.0)
Platelets: 363 10*3/uL (ref 150.0–400.0)
RBC: 5.3 Mil/uL — ABNORMAL HIGH (ref 3.87–5.11)
RDW: 14.2 % (ref 11.5–15.5)
WBC: 8.1 10*3/uL (ref 4.0–10.5)

## 2023-01-01 LAB — COMPREHENSIVE METABOLIC PANEL
ALT: 20 U/L (ref 0–35)
AST: 20 U/L (ref 0–37)
Albumin: 4.2 g/dL (ref 3.5–5.2)
Alkaline Phosphatase: 117 U/L (ref 39–117)
BUN: 14 mg/dL (ref 6–23)
CO2: 23 meq/L (ref 19–32)
Calcium: 9.1 mg/dL (ref 8.4–10.5)
Chloride: 105 meq/L (ref 96–112)
Creatinine, Ser: 0.7 mg/dL (ref 0.40–1.20)
GFR: 116.44 mL/min (ref >60.00)
Glucose, Bld: 87 mg/dL (ref 70–99)
Potassium: 4.2 meq/L (ref 3.5–5.1)
Sodium: 138 meq/L (ref 135–145)
Total Bilirubin: 0.2 mg/dL (ref 0.2–1.2)
Total Protein: 7.6 g/dL (ref 6.0–8.3)

## 2023-01-01 LAB — TSH: TSH: 1.16 u[IU]/mL (ref 0.35–5.50)

## 2023-01-01 LAB — VITAMIN D 25 HYDROXY (VIT D DEFICIENCY, FRACTURES): VITD: 28.98 ng/mL — ABNORMAL LOW (ref 30.00–100.00)

## 2023-01-01 LAB — VITAMIN B12: Vitamin B-12: 472 pg/mL (ref 211–911)

## 2023-01-01 LAB — T4, FREE: Free T4: 0.82 ng/dL (ref 0.60–1.60)

## 2023-01-01 MED ORDER — BREZTRI AEROSPHERE 160-9-4.8 MCG/ACT IN AERO: 2.0000 | INHALATION_SPRAY | Freq: Two times a day (BID) | RESPIRATORY_TRACT | 11 refills | Status: AC

## 2023-01-01 MED ORDER — ESCITALOPRAM OXALATE 10 MG PO TABS: 10.0000 mg | ORAL_TABLET | Freq: Every day | ORAL | 1 refills | Status: DC

## 2023-01-01 MED ORDER — AIRSUPRA 90-80 MCG/ACT IN AERO: 2.0000 | INHALATION_SPRAY | Freq: Four times a day (QID) | RESPIRATORY_TRACT | 2 refills | Status: AC | PRN

## 2023-01-01 NOTE — Patient Instructions (Signed)
Welcome to [facility]! It was a pleasure meeting you today.  Labs today  Refills sent  For 5-7 days, take 1/2 tab of sertraline daily, then stop altogether and start on Lexapro 10 mg daily.   PLEASE NOTE:  If you had any LAB tests please let us know if you have not heard back within a few days. You may see your results on MyChart before we have a chance to review them but we will give you a call once they are reviewed by [place]. If we ordered any REFERRALS today, please let us know if you have not heard from their office within the next two weeks. Let us know through MyChart if you are needing REFILLS, or have your pharmacy send [place] the request. You can also use MyChart to communicate with me or any office staff.  Please try these tips to maintain a healthy lifestyle:  Eat most of your calories during the day when you are active. Eliminate processed foods including packaged sweets (pies, cakes, cookies), reduce intake of potatoes, white bread, white pasta, and white rice. Look for whole grain options, oat flour or almond flour.  Each meal should contain half fruits/vegetables, one quarter protein, and one quarter carbs (no bigger than a computer mouse).  Cut down on sweet beverages. This includes juice, soda, and sweet tea. Also watch fruit intake, though this is a healthier sweet option, it still contains natural sugar! Limit to 3 servings daily.  Drink at least 1 glass of water with each meal and aim for at least 8 glasses (64 ounces) per day.  Exercise at least 150 minutes every week to the best of your ability.    Take Care,  Khalia Gong, PA-C

## 2023-01-01 NOTE — Progress Notes (Signed)
Subjective:    Patient ID: Vickie Maldonado, female    DOB: 31-Dec-1990, 32 y.o.   MRN: [id number]  Chief Complaint  Patient presents with   New Patient (Initial Visit)    New pt in office to establish care with PCP; last seen [facility] in 2022;     HPI 32 y.o. patient presents today for new patient establishment with me. Here with daughter, Vickie Maldonado. Has 2.5 yo and 67 mo old sons at home - NSVD - no pregnancy complications per patient.   Current Care Team: GYN Pulmonology - 2021 -COVID dyspnea Asthma / allergy    Acute Concerns: Stomach constantly feels upset. Super loose or flat stools the last few months. Fecal urgency with cramping just prior. No correlation with food or eating.  Feels hot and sweaty, hands peeling. Trouble losing weight.  "My body doesn't feel right."  Still breastfeeding.  Periods haven't started up again yet since delivery No hematochezia, she has hx of hemorrhoids  Chronic Concerns: See PMH listed below, as well as A/P for details on issues we specifically discussed during today's visit.      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   GERD (gastroesophageal reflux disease)    Headache    Sinus tachycardia     Past Surgical History:  Procedure Laterality Date   CESAREAN SECTION N/A 06/04/2018   Procedure: CESAREAN SECTION;  Surgeon: [name], MD;  Location: [facility]IR[facility];  Service: Obstetrics;  Laterality: N/A;   WISDOM TOOTH EXTRACTION      Family History  Problem Relation Age of Onset   Hypertension Mother    Anxiety disorder Mother    Arthritis Mother    Depression Mother    Hashimoto's thyroiditis Mother    Hypertension Father    Arthritis Father    Asthma Father    ADD / ADHD Sister    Asthma Sister    Miscarriages / Stillbirths Sister    Colon cancer Maternal Grandfather    Breast cancer Paternal Grandmother    Hypertension  Paternal Grandfather    Pancreatic cancer Paternal Grandfather    Breast cancer Maternal Aunt    Cancer Paternal Aunt     Social History   Tobacco Use   Smoking status: Never   Smokeless tobacco: Never  Vaping Use   Vaping status: Never Used  Substance Use Topics   Alcohol use: Never   Drug use: Never     Allergies  Allergen Reactions   Oxycodone Nausea And Vomiting   Latex Hives    Review of Systems NEGATIVE UNLESS OTHERWISE INDICATED IN HPI      Objective:     BP 120/74 (BP Location: Right Arm)   Pulse 76   Temp (!) 97.1 F (36.2 C) (Temporal)   Ht 5\' 7"  (1.702 m)   Wt 209 lb 3.2 oz (94.9 kg)   SpO2 97%   Breastfeeding Yes   BMI 32.77 kg/m   Wt Readings from Last 3 Encounters:  01/01/23 209 lb 3.2 oz (94.9 kg)  06/07/22 220 lb 12.8 oz (100.2 kg)  04/26/22 209 lb 1.6 oz (94.8 kg)    BP Readings from Last 3 Encounters:  01/01/23 120/74  06/08/22 104/60  04/26/22 118/60     Physical Exam Vitals and nursing note reviewed.  Constitutional:  Appearance: Normal appearance. She is obese. She is not toxic-appearing.  HENT:     Head: Normocephalic and atraumatic.     Right Ear: External ear normal.     Left Ear: External ear normal.     Nose: Nose normal.     Mouth/Throat:     Mouth: Mucous membranes are moist.  Eyes:     Extraocular Movements: Extraocular movements intact.     Conjunctiva/sclera: Conjunctivae normal.     Pupils: Pupils are equal, round, and reactive to light.  Cardiovascular:     Rate and Rhythm: Normal rate and regular rhythm.     Pulses: Normal pulses.     Heart sounds: Normal heart sounds.  Pulmonary:     Effort: Pulmonary effort is normal.     Breath sounds: Normal breath sounds.  Abdominal:     General: Abdomen is flat. Bowel sounds are normal.     Palpations: Abdomen is soft.  Musculoskeletal:        General: Normal range of motion.     Cervical back: Normal range of motion and neck supple.  Skin:    General: Skin  is warm and dry.  Neurological:     General: No focal deficit present.     Mental Status: She is alert and oriented to person, place, and time.  Psychiatric:        Mood and Affect: Mood normal.        Behavior: Behavior normal.        Thought Content: Thought content normal.        Judgment: Judgment normal.        Assessment & Plan:  COVID-19 long hauler manifesting chronic dyspnea Assessment & Plan: Chronic, stable, refilled medications today.  COVID-19 infection September/2021. Patient also has history of asthma.   Orders: -     Breztri Aerosphere; Inhale 2 puffs into the lungs 2 (two) times daily.  Dispense: 1 each; Refill: 11 -     Airsupra; Inhale 2 puffs into the lungs every 6 (six) hours as needed.  Dispense: 10.7 g; Refill: 2  Moderate persistent asthma without complication -     Breztri Aerosphere; Inhale 2 puffs into the lungs 2 (two) times daily.  Dispense: 1 each; Refill: 11 -     Airsupra; Inhale 2 puffs into the lungs every 6 (six) hours as needed.  Dispense: 10.7 g; Refill: 2  Family history of Hashimoto thyroiditis -     TSH -     T4, free -     Thyroid Peroxidase Antibodies (TPO) (REFL)  Other fatigue -     VITAMIN D 25 Hydroxy (Vit-D Deficiency, Fractures) -     Vitamin B12 -     TSH -     Comprehensive metabolic panel -     CBC with Differential/Platelet -     T4, free -     Thyroid Peroxidase Antibodies (TPO) (REFL)  Adverse effect of sertraline, initial encounter  Anxiety Assessment & Plan: Sertraline most likely causing excessive sweating; shared-decision making to switch SSRI treatment at this time.  For 5-7 days, take 1/2 tab of sertraline daily, then stop altogether and start on Lexapro 10 mg daily.  Pt aware of risks vs benefits and possible adverse reactions Reassured OK with breastfeeding per UTD guidelines    Orders: -     Escitalopram Oxalate; Take 1 tablet (10 mg total) by mouth daily.  Dispense: 30 tablet; Refill: 1   Pleasant  new pt establishing  care Took care of chronic concerns Will check labs, treat pending any abnormal labs found Symptoms likely multifactorial, but no red flag on exam. Reassured her it can often take 1-2 years postpartum to feel back to 'normal' self again. She's doing a great job.     Return in about 6 weeks (around 02/12/2023) for recheck/follow-up.    Rhodesia Stanger M Gualberto Wahlen, PA-C

## 2023-01-04 NOTE — Assessment & Plan Note (Signed)
Chronic, stable, refilled medications today.  COVID-19 infection September/2021. Patient also has history of asthma.

## 2023-01-04 NOTE — Assessment & Plan Note (Addendum)
Sertraline most likely causing excessive sweating; shared-decision making to switch SSRI treatment at this time.  For 5-7 days, take 1/2 tab of sertraline daily, then stop altogether and start on Lexapro 10 mg daily.  Pt aware of risks vs benefits and possible adverse reactions Reassured OK with breastfeeding per UTD guidelines

## 2023-01-05 LAB — THYROID PEROXIDASE ANTIBODIES (TPO) (REFL): Thyroperoxidase Ab SerPl-aCnc: 5 [IU]/mL (ref <9)

## 2023-01-22 ENCOUNTER — Encounter: Payer: Self-pay | Admitting: Physician Assistant

## 2023-01-22 NOTE — Telephone Encounter (Signed)
Please see patient message and advise.

## 2023-01-26 NOTE — Telephone Encounter (Signed)
Please see patient response

## 2023-01-26 NOTE — Telephone Encounter (Signed)
Please see patient message and advise.

## 2023-01-27 ENCOUNTER — Other Ambulatory Visit: Payer: Self-pay | Admitting: Physician Assistant

## 2023-01-27 DIAGNOSIS — F419 Anxiety disorder, unspecified: Secondary | ICD-10-CM

## 2023-02-06 ENCOUNTER — Ambulatory Visit: Payer: No Typology Code available for payment source | Admitting: Family

## 2023-02-07 ENCOUNTER — Telehealth: Payer: No Typology Code available for payment source

## 2023-02-07 DIAGNOSIS — J019 Acute sinusitis, unspecified: Secondary | ICD-10-CM | POA: Diagnosis not present

## 2023-02-07 MED ORDER — AMOXICILLIN-POT CLAVULANATE 875-125 MG PO TABS: 1.0000 | ORAL_TABLET | Freq: Two times a day (BID) | ORAL | 0 refills | Status: AC

## 2023-02-07 NOTE — Progress Notes (Signed)
Virtual Visit Consent   Vickie Maldonado, you are scheduled for a virtual visit with a [facility] provider today. Just as with appointments in the office, your consent must be obtained to participate. Your consent will be active for this visit and any virtual visit you may have with one of our providers in the next 365 days. If you have a MyChart account, a copy of this consent can be sent to you electronically.  As this is a virtual visit, video technology does not allow for your provider to perform a traditional examination. This may limit your provider's ability to fully assess your condition. If your provider identifies any concerns that need to be evaluated in person or the need to arrange testing (such as labs, EKG, etc.), we will make arrangements to do so. Although advances in technology are sophisticated, we cannot ensure that it will always work on either your end or our end. If the connection with a video visit is poor, the visit may have to be switched to a telephone visit. With either a video or telephone visit, we are not always able to ensure that we have a secure connection.  By engaging in this virtual visit, you consent to the provision of healthcare and authorize for your insurance to be billed (if applicable) for the services provided during this visit. Depending on your insurance coverage, you may receive a charge related to this service.  I need to obtain your verbal consent now. Are you willing to proceed with your visit today? Vickie Maldonado has provided verbal consent on 02/07/2023 for a virtual visit (video or telephone). Reed Pandy, [place]  Date: 02/07/2023 8:35 AM  Virtual Visit via Video Note   I, Reed Pandy, connected with  TAMLA KILLMEYER  ([id number], [birth year]) on 02/07/23 at  8:30 AM EDT by a video-enabled telemedicine application and verified that I am speaking with the correct person using two identifiers.  Location: Patient: Virtual Visit Location Patient:  Home Provider: Virtual Visit Location Provider: Home Office   I discussed the limitations of evaluation and management by telemedicine and the availability of in person appointments. The patient expressed understanding and agreed to proceed.    History of Present Illness: Vickie Maldonado is a 32 y.o. who identifies as a female who was assigned female at birth, and is being seen today for c/o I believe I have a sinus infection.  Pt states her kids were really sick and she also became sick.  Pt states she is prone to getting them after being sick.  Pt states symptoms started on Monday.  Pt states left side of face is in a lot of pain especially when blowing nose.  Pt states mucus is neon green and yellow when she blows her nose.  Pt states she has a cough but is not a bothersome.  Pt states taking Advil because she is breast feeding. Pt also using saline spray.   HPI: HPI  Problems:  Patient Active Problem List   Diagnosis Date Noted   Moderate persistent asthma without complication 01/01/2023   Anxiety 01/01/2023   Second degree perineal laceration 06/08/2022   Postpartum care following vaginal delivery 2/17 06/08/2022   Normal labor and delivery 06/07/2022   VBAC, delivered 05/24/2020   COVID-19 long hauler manifesting chronic dyspnea 03/01/2020    Allergies:  Allergies  Allergen Reactions   Oxycodone Nausea And Vomiting   Latex Hives   Medications:  Current Outpatient Medications:    amoxicillin-clavulanate (AUGMENTIN)  875-125 MG tablet, Take 1 tablet by mouth 2 (two) times daily for 10 days., Disp: 20 tablet, Rfl: 0   albuterol (VENTOLIN HFA) 108 (90 Base) MCG/ACT inhaler, Inhale 1-2 puffs into the lungs every 6 (six) hours as needed for wheezing or shortness of breath. (Patient taking differently: Inhale 2 puffs into the lungs as needed for wheezing or shortness of breath.), Disp: 8 g, Rfl: 1   Albuterol-Budesonide (AIRSUPRA) 90-80 MCG/ACT AERO, Inhale 2 puffs into the lungs every 6  (six) hours as needed., Disp: 10.7 g, Rfl: 2   BREZTRI AEROSPHERE 160-9-4.8 MCG/ACT AERO, Inhale 2 puffs into the lungs 2 (two) times daily., Disp: 1 each, Rfl: 11   cetirizine (ZYRTEC) 10 MG tablet, Take 10 mg by mouth daily., Disp: , Rfl:    escitalopram (LEXAPRO) 10 MG tablet, TAKE 1 TABLET BY MOUTH EVERY DAY, Disp: 90 tablet, Rfl: 0   sertraline (ZOLOFT) 100 MG tablet, Take 100 mg by mouth daily., Disp: , Rfl:   Observations/Objective: Patient is well-developed, well-nourished in no acute distress.  Resting comfortably at home.  Head is normocephalic, atraumatic.  No labored breathing.  Speech is clear and coherent with logical content.  Patient is alert and oriented at baseline.    Assessment and Plan: 1. Acute sinusitis, recurrence not specified, unspecified location - amoxicillin-clavulanate (AUGMENTIN) 875-125 MG tablet; Take 1 tablet by mouth 2 (two) times daily for 10 days.  Dispense: 20 tablet; Refill: 0  -Start Augmentin -Pt to F/U with PCP or urgent care for worsening symptoms  Follow Up Instructions: I discussed the assessment and treatment plan with the patient. The patient was provided an opportunity to ask questions and all were answered. The patient agreed with the plan and demonstrated an understanding of the instructions.  A copy of instructions were sent to the patient via MyChart unless otherwise noted below.     The patient was advised to call back or seek an in-person evaluation if the symptoms worsen or if the condition fails to improve as anticipated.    Reed Pandy, PA-C

## 2023-02-07 NOTE — Patient Instructions (Signed)
Ranell Patrick, thank you for joining Reed Pandy, PA-C for today's virtual visit.  While this provider is not your primary care provider (PCP), if your PCP is located in our provider database this encounter information will be shared with them immediately following your visit.   A [facility] MyChart account gives you access to today's visit and all your visits, tests, and labs performed at [facility] " click here if you don't have a [facility] MyChart account or go to [email or web address]  Consent: (Patient) Vickie Maldonado provided verbal consent for this virtual visit at the beginning of the encounter.  Current Medications:  Current Outpatient Medications:    amoxicillin-clavulanate (AUGMENTIN) 875-125 MG tablet, Take 1 tablet by mouth 2 (two) times daily for 10 days., Disp: 20 tablet, Rfl: 0   albuterol (VENTOLIN HFA) 108 (90 Base) MCG/ACT inhaler, Inhale 1-2 puffs into the lungs every 6 (six) hours as needed for wheezing or shortness of breath. (Patient taking differently: Inhale 2 puffs into the lungs as needed for wheezing or shortness of breath.), Disp: 8 g, Rfl: 1   Albuterol-Budesonide (AIRSUPRA) 90-80 MCG/ACT AERO, Inhale 2 puffs into the lungs every 6 (six) hours as needed., Disp: 10.7 g, Rfl: 2   BREZTRI AEROSPHERE 160-9-4.8 MCG/ACT AERO, Inhale 2 puffs into the lungs 2 (two) times daily., Disp: 1 each, Rfl: 11   cetirizine (ZYRTEC) 10 MG tablet, Take 10 mg by mouth daily., Disp: , Rfl:    escitalopram (LEXAPRO) 10 MG tablet, TAKE 1 TABLET BY MOUTH EVERY DAY, Disp: 90 tablet, Rfl: 0   sertraline (ZOLOFT) 100 MG tablet, Take 100 mg by mouth daily., Disp: , Rfl:    Medications ordered in this encounter:  Meds ordered this encounter  Medications   amoxicillin-clavulanate (AUGMENTIN) 875-125 MG tablet    Sig: Take 1 tablet by mouth 2 (two) times daily for 10 days.    Dispense:  20 tablet    Refill:  0     *If you need refills on other medications prior to  your next appointment, please contact your pharmacy*  Follow-Up: Call back or seek an in-person evaluation if the symptoms worsen or if the condition fails to improve as anticipated.  [facility] [phone]  Other Instructions Sinus Infection, Adult A sinus infection, also called sinusitis, is inflammation of your sinuses. Sinuses are hollow spaces in the bones around your face. Your sinuses are located: Around your eyes. In the middle of your forehead. Behind your nose. In your cheekbones. Mucus normally drains out of your sinuses. When your nasal tissues become inflamed or swollen, mucus can become trapped or blocked. This allows bacteria, viruses, and fungi to grow, which leads to infection. Most infections of the sinuses are caused by a virus. A sinus infection can develop quickly. It can last for up to 4 weeks (acute) or for more than 12 weeks (chronic). A sinus infection often develops after a cold. What are the causes? This condition is caused by anything that creates swelling in the sinuses or stops mucus from draining. This includes: Allergies. Asthma. Infection from bacteria or viruses. Deformities or blockages in your nose or sinuses. Abnormal growths in the nose (nasal polyps). Pollutants, such as chemicals or irritants in the air. Infection from fungi. This is rare. What increases the risk? You are more likely to develop this condition if you: Have a weak body defense system (immune system). Do a lot of swimming or diving. Overuse nasal sprays. Smoke. What are  the signs or symptoms? The main symptoms of this condition are pain and a feeling of pressure around the affected sinuses. Other symptoms include: Stuffy nose or congestion that makes it difficult to breathe through your nose. Thick yellow or greenish drainage from your nose. Tenderness, swelling, and warmth over the affected sinuses. A cough that may get worse at night. Decreased sense of  smell and taste. Extra mucus that collects in the throat or the back of the nose (postnasal drip) causing a sore throat or bad breath. Tiredness (fatigue). Fever. How is this diagnosed? This condition is diagnosed based on: Your symptoms. Your medical history. A physical exam. Tests to find out if your condition is acute or chronic. This may include: Checking your nose for nasal polyps. Viewing your sinuses using a device that has a light (endoscope). Testing for allergies or bacteria. Imaging tests, such as an MRI or CT scan. In rare cases, a bone biopsy may be done to rule out more serious types of fungal sinus disease. How is this treated? Treatment for a sinus infection depends on the cause and whether your condition is chronic or acute. If caused by a virus, your symptoms should go away on their own within 10 days. You may be given medicines to relieve symptoms. They include: Medicines that shrink swollen nasal passages (decongestants). A spray that eases inflammation of the nostrils (topical intranasal corticosteroids). Rinses that help get rid of thick mucus in your nose (nasal saline washes). Medicines that treat allergies (antihistamines). Over-the-counter pain relievers. If caused by bacteria, your health care provider may recommend waiting to see if your symptoms improve. Most bacterial infections will get better without antibiotic medicine. You may be given antibiotics if you have: A severe infection. A weak immune system. If caused by narrow nasal passages or nasal polyps, surgery may be needed. Follow these instructions at home: Medicines Take, use, or apply over-the-counter and prescription medicines only as told by your health care provider. These may include nasal sprays. If you were prescribed an antibiotic medicine, take it as told by your health care provider. Do not stop taking the antibiotic even if you start to feel better. Hydrate and humidify  Drink enough  fluid to keep your urine pale yellow. Staying hydrated will help to thin your mucus. Use a cool mist humidifier to keep the humidity level in your home above 50%. Inhale steam for 10-15 minutes, 3-4 times a day, or as told by your health care provider. You can do this in the bathroom while a hot shower is running. Limit your exposure to cool or dry air. Rest Rest as much as possible. Sleep with your head raised (elevated). Make sure you get enough sleep each night. General instructions  Apply a warm, moist washcloth to your face 3-4 times a day or as told by your health care provider. This will help with discomfort. Use nasal saline washes as often as told by your health care provider. Wash your hands often with soap and water to reduce your exposure to germs. If soap and water are not available, use hand sanitizer. Do not smoke. Avoid being around people who are smoking (secondhand smoke). Keep all follow-up visits. This is important. Contact a health care provider if: You have a fever. Your symptoms get worse. Your symptoms do not improve within 10 days. Get help right away if: You have a severe headache. You have persistent vomiting. You have severe pain or swelling around your face or eyes. You have  vision problems. You develop confusion. Your neck is stiff. You have trouble breathing. These symptoms may be an emergency. Get help right away. Call 911. Do not wait to see if the symptoms will go away. Do not drive yourself to the hospital. Summary A sinus infection is soreness and inflammation of your sinuses. Sinuses are hollow spaces in the bones around your face. This condition is caused by nasal tissues that become inflamed or swollen. The swelling traps or blocks the flow of mucus. This allows bacteria, viruses, and fungi to grow, which leads to infection. If you were prescribed an antibiotic medicine, take it as told by your health care provider. Do not stop taking the  antibiotic even if you start to feel better. Keep all follow-up visits. This is important. This information is not intended to replace advice given to you by your health care provider. Make sure you discuss any questions you have with your health care provider. Document Revised: 03/12/2021 Document Reviewed: 03/12/2021 Elsevier Patient Education  2024 Elsevier Inc.    If you have been instructed to have an in-person evaluation today at a local Urgent Care facility, please use the link below. It will take you to a list of all of our available [facility], including address, phone number and hours of operation. Please do not delay care.  [facility]  If you or a family member do not have a primary care provider, use the link below to schedule a visit and establish care. When you choose a [facility] primary care physician or advanced practice provider, you gain a long-term partner in health. Find a Primary Care Provider  Learn more about [facility]'s in-office and virtual care options:  - Get Care Now

## 2023-02-13 ENCOUNTER — Ambulatory Visit: Payer: No Typology Code available for payment source | Admitting: Physician Assistant

## 2023-03-12 ENCOUNTER — Telehealth: Payer: No Typology Code available for payment source | Admitting: Physician Assistant

## 2023-03-12 ENCOUNTER — Encounter: Payer: Self-pay | Admitting: Physician Assistant

## 2023-03-12 DIAGNOSIS — R3989 Other symptoms and signs involving the genitourinary system: Secondary | ICD-10-CM

## 2023-03-12 MED ORDER — AMOXICILLIN 500 MG PO CAPS: 500.0000 mg | ORAL_CAPSULE | Freq: Two times a day (BID) | ORAL | 0 refills | Status: AC

## 2023-03-12 NOTE — Progress Notes (Signed)
Virtual Visit Consent   Vickie Maldonado, you are scheduled for a virtual visit with a [facility] provider today. Just as with appointments in the office, your consent must be obtained to participate. Your consent will be active for this visit and any virtual visit you may have with one of our providers in the next 365 days. If you have a MyChart account, a copy of this consent can be sent to you electronically.  As this is a virtual visit, video technology does not allow for your provider to perform a traditional examination. This may limit your provider's ability to fully assess your condition. If your provider identifies any concerns that need to be evaluated in person or the need to arrange testing (such as labs, EKG, etc.), we will make arrangements to do so. Although advances in technology are sophisticated, we cannot ensure that it will always work on either your end or our end. If the connection with a video visit is poor, the visit may have to be switched to a telephone visit. With either a video or telephone visit, we are not always able to ensure that we have a secure connection.  By engaging in this virtual visit, you consent to the provision of healthcare and authorize for your insurance to be billed (if applicable) for the services provided during this visit. Depending on your insurance coverage, you may receive a charge related to this service.  I need to obtain your verbal consent now. Are you willing to proceed with your visit today? Vickie Maldonado has provided verbal consent on 03/12/2023 for a virtual visit (video or telephone). Vickie Maldonado, [place]  Date: 03/12/2023 7:56 AM  Virtual Visit via Video Note   I, Udell Blasingame, connected with  [name]  ([id number], [birth year]) on 03/12/23 at  7:45 AM EST by a video-enabled telemedicine application and verified that I am speaking with the correct person using two identifiers.  Location: Patient: Virtual Visit Location Patient:  Home Provider: Virtual Visit Location Provider: Home Office   I discussed the limitations of evaluation and management by telemedicine and the availability of in person appointments. The patient expressed understanding and agreed to proceed.    History of Present Illness: Vickie Maldonado is a 32 y.o. who identifies as a female who was assigned female at birth, and is being seen today for urinary symptoms.  HPI: 32 y/o F via telehealth c/o dysuria, frequency and urgency x 3 days. +back pain, but not sure if due to lifting an carrying 3 kids or due to uti symptoms. Denies fever or chills. Pt is breastfeeding currently. H/o 3-4 UTIs over the past few years.   Urinary Tract Infection     Problems:  Patient Active Problem List   Diagnosis Date Noted   Moderate persistent asthma without complication 01/01/2023   Anxiety 01/01/2023   Second degree perineal laceration 06/08/2022   Postpartum care following vaginal delivery 2/17 06/08/2022   Normal labor and delivery 06/07/2022   VBAC, delivered 05/24/2020   COVID-19 long hauler manifesting chronic dyspnea 03/01/2020    Allergies:  Allergies  Allergen Reactions   Oxycodone Nausea And Vomiting   Latex Hives   Medications:  Current Outpatient Medications:    amoxicillin (AMOXIL) 500 MG capsule, Take 1 capsule (500 mg total) by mouth 2 (two) times daily for 7 days., Disp: 14 capsule, Rfl: 0   albuterol (VENTOLIN HFA) 108 (90 Base) MCG/ACT inhaler, Inhale 1-2 puffs into the lungs every 6 (six) hours  as needed for wheezing or shortness of breath. (Patient taking differently: Inhale 2 puffs into the lungs as needed for wheezing or shortness of breath.), Disp: 8 g, Rfl: 1   Albuterol-Budesonide (AIRSUPRA) 90-80 MCG/ACT AERO, Inhale 2 puffs into the lungs every 6 (six) hours as needed., Disp: 10.7 g, Rfl: 2   BREZTRI AEROSPHERE 160-9-4.8 MCG/ACT AERO, Inhale 2 puffs into the lungs 2 (two) times daily., Disp: 1 each, Rfl: 11   cetirizine (ZYRTEC)  10 MG tablet, Take 10 mg by mouth daily., Disp: , Rfl:    escitalopram (LEXAPRO) 10 MG tablet, TAKE 1 TABLET BY MOUTH EVERY DAY, Disp: 90 tablet, Rfl: 0   sertraline (ZOLOFT) 100 MG tablet, Take 100 mg by mouth daily., Disp: , Rfl:   Observations/Objective: Patient is well-developed, well-nourished in no acute distress.  Resting comfortably  at home.  Head is normocephalic, atraumatic.  No labored breathing.  Speech is clear and coherent with logical content.  Patient is alert and oriented at baseline.    Assessment and Plan: 1. Suspected UTI - amoxicillin (AMOXIL) 500 MG capsule; Take 1 capsule (500 mg total) by mouth 2 (two) times daily for 7 days.  Dispense: 14 capsule; Refill: 0  Increase fluids Start Amoxicillin as prescribed. Safe while breastfeeding.  Continue to watch for worsening symptoms.  Schedule a face to face appointment if symptoms don't improve.  Pt verbalized understanding and in agreement.    Follow Up Instructions: I discussed the assessment and treatment plan with the patient. The patient was provided an opportunity to ask questions and all were answered. The patient agreed with the plan and demonstrated an understanding of the instructions.  A copy of instructions were sent to the patient via MyChart unless otherwise noted below.   Patient has requested to receive PHI (AVS, Work Notes, etc) pertaining to this video visit through e-mail as they are currently without active MyChart. They have voiced understand that email is not considered secure and their health information could be viewed by someone other than the patient.   The patient was advised to call back or seek an in-person evaluation if the symptoms worsen or if the condition fails to improve as anticipated.    Vickie Better, PA-C

## 2023-03-12 NOTE — Patient Instructions (Signed)
  Ranell Patrick, thank you for joining Gilberto Better, PA-C for today's virtual visit.  While this provider is not your primary care provider (PCP), if your PCP is located in our provider database this encounter information will be shared with them immediately following your visit.   A [facility] MyChart account gives you access to today's visit and all your visits, tests, and labs performed at [facility] " click here if you don't have a [facility] MyChart account or go to [email or web address]  Consent: (Patient) Vickie Maldonado provided verbal consent for this virtual visit at the beginning of the encounter.  Current Medications:  Current Outpatient Medications:    albuterol (VENTOLIN HFA) 108 (90 Base) MCG/ACT inhaler, Inhale 1-2 puffs into the lungs every 6 (six) hours as needed for wheezing or shortness of breath. (Patient taking differently: Inhale 2 puffs into the lungs as needed for wheezing or shortness of breath.), Disp: 8 g, Rfl: 1   Albuterol-Budesonide (AIRSUPRA) 90-80 MCG/ACT AERO, Inhale 2 puffs into the lungs every 6 (six) hours as needed., Disp: 10.7 g, Rfl: 2   BREZTRI AEROSPHERE 160-9-4.8 MCG/ACT AERO, Inhale 2 puffs into the lungs 2 (two) times daily., Disp: 1 each, Rfl: 11   cetirizine (ZYRTEC) 10 MG tablet, Take 10 mg by mouth daily., Disp: , Rfl:    escitalopram (LEXAPRO) 10 MG tablet, TAKE 1 TABLET BY MOUTH EVERY DAY, Disp: 90 tablet, Rfl: 0   sertraline (ZOLOFT) 100 MG tablet, Take 100 mg by mouth daily., Disp: , Rfl:    Medications ordered in this encounter:  No orders of the defined types were placed in this encounter.    *If you need refills on other medications prior to your next appointment, please contact your pharmacy*  Follow-Up: Call back or seek an in-person evaluation if the symptoms worsen or if the condition fails to improve as anticipated.  [facility] [phone]  Other Instructions Increase fluids Start  Amoxicillin as prescribed. Safe while breastfeeding.  Continue to watch for worsening symptoms.  Schedule a face to face appointment if symptoms don't improve.    If you have been instructed to have an in-person evaluation today at a local Urgent Care facility, please use the link below. It will take you to a list of all of our available [facility], including address, phone number and hours of operation. Please do not delay care.  [facility]  If you or a family member do not have a primary care provider, use the link below to schedule a visit and establish care. When you choose a [facility] primary care physician or advanced practice provider, you gain a long-term partner in health. Find a Primary Care Provider  Learn more about [facility]'s in-office and virtual care options:  - Get Care Now

## 2023-05-01 ENCOUNTER — Telehealth: Payer: No Typology Code available for payment source | Admitting: Nurse Practitioner

## 2023-05-01 DIAGNOSIS — R3989 Other symptoms and signs involving the genitourinary system: Secondary | ICD-10-CM

## 2023-05-01 MED ORDER — CEPHALEXIN 500 MG PO CAPS: 500.0000 mg | ORAL_CAPSULE | Freq: Two times a day (BID) | ORAL | 0 refills | Status: AC

## 2023-05-01 NOTE — Progress Notes (Signed)
 Virtual Visit Consent   Vickie Maldonado, you are scheduled for a virtual visit with a [facility] provider today. Just as with appointments in the office, your consent must be obtained to participate. Your consent will be active for this visit and any virtual visit you may have with one of our providers in the next 365 days. If you have a MyChart account, a copy of this consent can be sent to you electronically.  As this is a virtual visit, video technology does not allow for your provider to perform a traditional examination. This may limit your provider's ability to fully assess your condition. If your provider identifies any concerns that need to be evaluated in person or the need to arrange testing (such as labs, EKG, etc.), we will make arrangements to do so. Although advances in technology are sophisticated, we cannot ensure that it will always work on either your end or our end. If the connection with a video visit is poor, the visit may have to be switched to a telephone visit. With either a video or telephone visit, we are not always able to ensure that we have a secure connection.  By engaging in this virtual visit, you consent to the provision of healthcare and authorize for your insurance to be billed (if applicable) for the services provided during this visit. Depending on your insurance coverage, you may receive a charge related to this service.  I need to obtain your verbal consent now. Are you willing to proceed with your visit today? NEKEYA BRISKI has provided verbal consent on 05/01/2023 for a virtual visit (video or telephone). Lauraine Kitty, FNP  Date: 05/01/2023 9:12 AM  Virtual Visit via Video Note   I, Lauraine Kitty, connected with  Vickie Maldonado  ([id number], [birth year]) on 05/01/23 at  9:15 AM EST by a video-enabled telemedicine application and verified that I am speaking with the correct person using two identifiers.  Location: Patient: Virtual Visit Location Patient:  Home Provider: Virtual Visit Location Provider: Home Office   I discussed the limitations of evaluation and management by telemedicine and the availability of in person appointments. The patient expressed understanding and agreed to proceed.    History of Present Illness: Vickie Maldonado is a 33 y.o. who identifies as a female who was assigned female at birth, and is being seen today for symptoms consistent with a UTI  She started to experience symptoms including: urinary urgency, pain with urination and constant frequency  Symptom onset was 3 days ago  She denies a fever  No N/V or back pain   She did have a UTI 2-3 months ago without complication    Problems:  Patient Active Problem List   Diagnosis Date Noted   Moderate persistent asthma without complication 01/01/2023   Anxiety 01/01/2023   Second degree perineal laceration 06/08/2022   Postpartum care following vaginal delivery 2/17 06/08/2022   Normal labor and delivery 06/07/2022   VBAC, delivered 05/24/2020   COVID-19 long hauler manifesting chronic dyspnea 03/01/2020    Allergies:  Allergies  Allergen Reactions   Oxycodone  Nausea And Vomiting   Latex Hives   Medications:  Current Outpatient Medications:    albuterol  (VENTOLIN  HFA) 108 (90 Base) MCG/ACT inhaler, Inhale 1-2 puffs into the lungs every 6 (six) hours as needed for wheezing or shortness of breath. (Patient taking differently: Inhale 2 puffs into the lungs as needed for wheezing or shortness of breath.), Disp: 8 g, Rfl: 1   Albuterol -Budesonide  (AIRSUPRA )  90-80 MCG/ACT AERO, Inhale 2 puffs into the lungs every 6 (six) hours as needed., Disp: 10.7 g, Rfl: 2   BREZTRI  AEROSPHERE 160-9-4.8 MCG/ACT AERO, Inhale 2 puffs into the lungs 2 (two) times daily., Disp: 1 each, Rfl: 11   cetirizine (ZYRTEC) 10 MG tablet, Take 10 mg by mouth daily., Disp: , Rfl:    escitalopram  (LEXAPRO ) 10 MG tablet, TAKE 1 TABLET BY MOUTH EVERY DAY, Disp: 90 tablet, Rfl: 0   sertraline  (ZOLOFT) 100 MG tablet, Take 100 mg by mouth daily., Disp: , Rfl:   Observations/Objective: Patient is well-developed, well-nourished in no acute distress.  Resting comfortably  at home.  Head is normocephalic, atraumatic.  No labored breathing.  Speech is clear and coherent with logical content.  Patient is alert and oriented at baseline.    Assessment and Plan:  1. Suspected UTI (Primary)  Push fluids   - cephALEXin  (KEFLEX ) 500 MG capsule; Take 1 capsule (500 mg total) by mouth 2 (two) times daily for 7 days.  Dispense: 14 capsule; Refill: 0     Follow Up Instructions: I discussed the assessment and treatment plan with the patient. The patient was provided an opportunity to ask questions and all were answered. The patient agreed with the plan and demonstrated an understanding of the instructions.  A copy of instructions were sent to the patient via MyChart unless otherwise noted below.    The patient was advised to call back or seek an in-person evaluation if the symptoms worsen or if the condition fails to improve as anticipated.    Lauraine Kitty, FNP

## 2023-06-30 ENCOUNTER — Telehealth: Admitting: Physician Assistant

## 2023-06-30 DIAGNOSIS — J019 Acute sinusitis, unspecified: Secondary | ICD-10-CM

## 2023-06-30 DIAGNOSIS — B9689 Other specified bacterial agents as the cause of diseases classified elsewhere: Secondary | ICD-10-CM

## 2023-06-30 MED ORDER — AMOXICILLIN-POT CLAVULANATE 875-125 MG PO TABS: 1.0000 | ORAL_TABLET | Freq: Two times a day (BID) | ORAL | 0 refills | Status: DC

## 2023-06-30 MED ORDER — FLUTICASONE PROPIONATE 50 MCG/ACT NA SUSP: 2.0000 | Freq: Every day | NASAL | 0 refills | Status: AC

## 2023-06-30 NOTE — Patient Instructions (Addendum)
 Ranell Patrick, thank you for joining Piedad Climes, PA-C for today's virtual visit.  While this provider is not your primary care provider (PCP), if your PCP is located in our provider database this encounter information will be shared with them immediately following your visit.   A [facility] MyChart account gives you access to today's visit and all your visits, tests, and labs performed at [facility] " click here if you don't have a [place] MyChart account or go to [email or web address]  Consent: (Patient) Vickie Maldonado provided verbal consent for this virtual visit at the beginning of the encounter.  Current Medications:  Current Outpatient Medications:    albuterol (VENTOLIN HFA) 108 (90 Base) MCG/ACT inhaler, Inhale 1-2 puffs into the lungs every 6 (six) hours as needed for wheezing or shortness of breath. (Patient taking differently: Inhale 2 puffs into the lungs as needed for wheezing or shortness of breath.), Disp: 8 g, Rfl: 1   Albuterol-Budesonide (AIRSUPRA) 90-80 MCG/ACT AERO, Inhale 2 puffs into the lungs every 6 (six) hours as needed., Disp: 10.7 g, Rfl: 2   BREZTRI AEROSPHERE 160-9-4.8 MCG/ACT AERO, Inhale 2 puffs into the lungs 2 (two) times daily., Disp: 1 each, Rfl: 11   cetirizine (ZYRTEC) 10 MG tablet, Take 10 mg by mouth daily., Disp: , Rfl:    escitalopram (LEXAPRO) 10 MG tablet, TAKE 1 TABLET BY MOUTH EVERY DAY, Disp: 90 tablet, Rfl: 0   sertraline (ZOLOFT) 100 MG tablet, Take 100 mg by mouth daily., Disp: , Rfl:    Medications ordered in this encounter:  No orders of the defined types were placed in this encounter.    *If you need refills on other medications prior to your next appointment, please contact your pharmacy*  Follow-Up: Call back or seek an in-person evaluation if the symptoms worsen or if the condition fails to improve as anticipated.  [facility] [phone]  Other Instructions Please take antibiotic  as directed.  Increase fluid intake.  Use Saline nasal spray.  Take a daily multivitamin. Use the Flonase as directed. Ok to continue the Tylenol Cold and Sinus OTC.  Place a humidifier in the bedroom.  Please call or return clinic if symptoms are not improving.  Sinusitis Sinusitis is redness, soreness, and swelling (inflammation) of the paranasal sinuses. Paranasal sinuses are air pockets within the bones of your face (beneath the eyes, the middle of the forehead, or above the eyes). In healthy paranasal sinuses, mucus is able to drain out, and air is able to circulate through them by way of your nose. However, when your paranasal sinuses are inflamed, mucus and air can become trapped. This can allow bacteria and other germs to grow and cause infection. Sinusitis can develop quickly and last only a short time (acute) or continue over a long period (chronic). Sinusitis that lasts for more than 12 weeks is considered chronic.  CAUSES  Causes of sinusitis include: Allergies. Structural abnormalities, such as displacement of the cartilage that separates your nostrils (deviated septum), which can decrease the air flow through your nose and sinuses and affect sinus drainage. Functional abnormalities, such as when the small hairs (cilia) that line your sinuses and help remove mucus do not work properly or are not present. SYMPTOMS  Symptoms of acute and chronic sinusitis are the same. The primary symptoms are pain and pressure around the affected sinuses. Other symptoms include: Upper toothache. Earache. Headache. Bad breath. Decreased sense of smell and taste. A cough, which  worsens when you are lying flat. Fatigue. Fever. Thick drainage from your nose, which often is green and may contain pus (purulent). Swelling and warmth over the affected sinuses. DIAGNOSIS  Your caregiver will perform a physical exam. During the exam, your caregiver may: Look in your nose for signs of abnormal growths in  your nostrils (nasal polyps). Tap over the affected sinus to check for signs of infection. View the inside of your sinuses (endoscopy) with a special imaging device with a light attached (endoscope), which is inserted into your sinuses. If your caregiver suspects that you have chronic sinusitis, one or more of the following tests may be recommended: Allergy tests. Nasal culture A sample of mucus is taken from your nose and sent to a lab and screened for bacteria. Nasal cytology A sample of mucus is taken from your nose and examined by your caregiver to determine if your sinusitis is related to an allergy. TREATMENT  Most cases of acute sinusitis are related to a viral infection and will resolve on their own within 10 days. Sometimes medicines are prescribed to help relieve symptoms (pain medicine, decongestants, nasal steroid sprays, or saline sprays).  However, for sinusitis related to a bacterial infection, your caregiver will prescribe antibiotic medicines. These are medicines that will help kill the bacteria causing the infection.  Rarely, sinusitis is caused by a fungal infection. In theses cases, your caregiver will prescribe antifungal medicine. For some cases of chronic sinusitis, surgery is needed. Generally, these are cases in which sinusitis recurs more than 3 times per year, despite other treatments. HOME CARE INSTRUCTIONS  Drink plenty of water. Water helps thin the mucus so your sinuses can drain more easily. Use a humidifier. Inhale steam 3 to 4 times a day (for example, sit in the bathroom with the shower running). Apply a warm, moist washcloth to your face 3 to 4 times a day, or as directed by your caregiver. Use saline nasal sprays to help moisten and clean your sinuses. Take over-the-counter or prescription medicines for pain, discomfort, or fever only as directed by your caregiver. SEEK IMMEDIATE MEDICAL CARE IF: You have increasing pain or severe headaches. You have nausea,  vomiting, or drowsiness. You have swelling around your face. You have vision problems. You have a stiff neck. You have difficulty breathing. MAKE SURE YOU:  Understand these instructions. Will watch your condition. Will get help right away if you are not doing well or get worse. Document Released: 04/07/2005 Document Revised: 06/30/2011 Document Reviewed: 04/22/2011 [facility] Patient Information 2014 [place], [place].    If you have been instructed to have an in-person evaluation today at a local Urgent Care facility, please use the link below. It will take you to a list of all of our available [facility], including address, phone number and hours of operation. Please do not delay care.  [facility]  If you or a family member do not have a primary care provider, use the link below to schedule a visit and establish care. When you choose a [facility] primary care physician or advanced practice provider, you gain a long-term partner in health. Find a Primary Care Provider  Learn more about [facility] in-office and virtual care options: [facility] - Get Care Now

## 2023-06-30 NOTE — Progress Notes (Signed)
 Virtual Visit Consent   LAVITA PONTIUS, you are scheduled for a virtual visit with a [facility] provider today. Just as with appointments in the office, your consent must be obtained to participate. Your consent will be active for this visit and any virtual visit you may have with one of our providers in the next 365 days. If you have a MyChart account, a copy of this consent can be sent to you electronically.  As this is a virtual visit, video technology does not allow for your provider to perform a traditional examination. This may limit your provider's ability to fully assess your condition. If your provider identifies any concerns that need to be evaluated in person or the need to arrange testing (such as labs, EKG, etc.), we will make arrangements to do so. Although advances in technology are sophisticated, we cannot ensure that it will always work on either your end or our end. If the connection with a video visit is poor, the visit may have to be switched to a telephone visit. With either a video or telephone visit, we are not always able to ensure that we have a secure connection.  By engaging in this virtual visit, you consent to the provision of healthcare and authorize for your insurance to be billed (if applicable) for the services provided during this visit. Depending on your insurance coverage, you may receive a charge related to this service.  I need to obtain your verbal consent now. Are you willing to proceed with your visit today? OLETHA TOLSON has provided verbal consent on 06/30/2023 for a virtual visit (video or telephone). Piedad Climes, [place]  Date: 06/30/2023 9:16 AM   Virtual Visit via Video Note   I, Piedad Climes, connected with  NOUR RODRIGUES  ([id number], May 14, 1990) on 06/30/23 at  9:15 AM EDT by a video-enabled telemedicine application and verified that I am speaking with the correct person using two identifiers.  Location: Patient: Virtual Visit  Location Patient: Home Provider: Virtual Visit Location Provider: Home Office   I discussed the limitations of evaluation and management by telemedicine and the availability of in person appointments. The patient expressed understanding and agreed to proceed.    History of Present Illness: DYNA FIGUEREO is a 33 y.o. who identifies as a female who was assigned female at birth, and is being seen today for continued nasal and head congestion with sinus pressure after dealing with the flu 2 days ago. Notes now with R maxillary/facial pain with change in nasal discharge to thick green/yellow. Has been trying to take OTC Mucinex, Tylenol Sinus, homeopathic medicines (belladonna). Notes facial pain is substantial. Denies fever at present.  HPI: HPI  Problems:  Patient Active Problem List   Diagnosis Date Noted   Moderate persistent asthma without complication 01/01/2023   Anxiety 01/01/2023   Second degree perineal laceration 06/08/2022   Postpartum care following vaginal delivery 2/17 06/08/2022   Normal labor and delivery 06/07/2022   VBAC, delivered 05/24/2020   COVID-19 long hauler manifesting chronic dyspnea 03/01/2020    Allergies:  Allergies  Allergen Reactions   Oxycodone Nausea And Vomiting   Latex Hives   Medications:  Current Outpatient Medications:    amoxicillin-clavulanate (AUGMENTIN) 875-125 MG tablet, Take 1 tablet by mouth 2 (two) times daily., Disp: 14 tablet, Rfl: 0   fluticasone (FLONASE) 50 MCG/ACT nasal spray, Place 2 sprays into both nostrils daily., Disp: 16 g, Rfl: 0   albuterol (VENTOLIN HFA) 108 (90 Base)  MCG/ACT inhaler, Inhale 1-2 puffs into the lungs every 6 (six) hours as needed for wheezing or shortness of breath. (Patient taking differently: Inhale 2 puffs into the lungs as needed for wheezing or shortness of breath.), Disp: 8 g, Rfl: 1   Albuterol-Budesonide (AIRSUPRA) 90-80 MCG/ACT AERO, Inhale 2 puffs into the lungs every 6 (six) hours as needed., Disp:  10.7 g, Rfl: 2   BREZTRI AEROSPHERE 160-9-4.8 MCG/ACT AERO, Inhale 2 puffs into the lungs 2 (two) times daily., Disp: 1 each, Rfl: 11   cetirizine (ZYRTEC) 10 MG tablet, Take 10 mg by mouth daily., Disp: , Rfl:   Observations/Objective: Patient is well-developed, well-nourished in no acute distress.  Resting comfortably at home.  Head is normocephalic, atraumatic.  No labored breathing. Speech is clear and coherent with logical content.  Patient is alert and oriented at baseline.   Assessment and Plan: 1. Acute bacterial sinusitis (Primary) - fluticasone (FLONASE) 50 MCG/ACT nasal spray; Place 2 sprays into both nostrils daily.  Dispense: 16 g; Refill: 0 - amoxicillin-clavulanate (AUGMENTIN) 875-125 MG tablet; Take 1 tablet by mouth 2 (two) times daily.  Dispense: 14 tablet; Refill: 0  Rx Augmentin.  Increase fluids.  Rest.  Saline nasal spray.  Probiotic.  Mucinex as directed.  Humidifier in bedroom. Flonase per orders.  Call or return to clinic if symptoms are not improving.   Follow Up Instructions: I discussed the assessment and treatment plan with the patient. The patient was provided an opportunity to ask questions and all were answered. The patient agreed with the plan and demonstrated an understanding of the instructions.  A copy of instructions were sent to the patient via MyChart unless otherwise noted below.   The patient was advised to call back or seek an in-person evaluation if the symptoms worsen or if the condition fails to improve as anticipated.    Piedad Climes, PA-C

## 2023-10-02 ENCOUNTER — Telehealth: Admitting: Physician Assistant

## 2023-10-02 DIAGNOSIS — R3989 Other symptoms and signs involving the genitourinary system: Secondary | ICD-10-CM

## 2023-10-02 MED ORDER — CEPHALEXIN 500 MG PO CAPS: 500.0000 mg | ORAL_CAPSULE | Freq: Two times a day (BID) | ORAL | 0 refills | Status: AC

## 2023-10-02 NOTE — Progress Notes (Signed)
 Virtual Visit Consent   Vickie Maldonado, you are scheduled for a virtual visit with a [facility] provider today. Just as with appointments in the office, your consent must be obtained to participate. Your consent will be active for this visit and any virtual visit you may have with one of our providers in the next 365 days. If you have a MyChart account, a copy of this consent can be sent to you electronically.  As this is a virtual visit, video technology does not allow for your provider to perform a traditional examination. This may limit your provider's ability to fully assess your condition. If your provider identifies any concerns that need to be evaluated in person or the need to arrange testing (such as labs, EKG, etc.), we will make arrangements to do so. Although advances in technology are sophisticated, we cannot ensure that it will always work on either your end or our end. If the connection with a video visit is poor, the visit may have to be switched to a telephone visit. With either a video or telephone visit, we are not always able to ensure that we have a secure connection.  By engaging in this virtual visit, you consent to the provision of healthcare and authorize for your insurance to be billed (if applicable) for the services provided during this visit. Depending on your insurance coverage, you may receive a charge related to this service.  I need to obtain your verbal consent now. Are you willing to proceed with your visit today? Vickie Maldonado has provided verbal consent on 10/02/2023 for a virtual visit (video or telephone). Angelia Kelp, PA-C  Date: 10/02/2023 4:52 PM   Virtual Visit via Video Note   I, Angelia Kelp, connected with  Vickie Maldonado  ([id number], [birth year]) on 10/02/23 at  4:45 PM EDT by a video-enabled telemedicine application and verified that I am speaking with the correct person using two identifiers.  Location: Patient: Virtual Visit  Location Patient: Home Provider: Virtual Visit Location Provider: Home Office   I discussed the limitations of evaluation and management by telemedicine and the availability of in person appointments. The patient expressed understanding and agreed to proceed.    History of Present Illness: Vickie Maldonado is a 33 y.o. who identifies as a female who was assigned female at birth, and is being seen today for dysuria.  HPI: Urinary Tract Infection  This is a new problem. The current episode started in the past 7 days (4 days). The problem occurs every urination. The problem has been gradually worsening. The quality of the pain is described as burning. The pain is mild. There has been no fever. Associated symptoms include a discharge, frequency, hesitancy, nausea (mild) and urgency. Pertinent negatives include no chills, flank pain, hematuria, sweats or vomiting. She has tried increased fluids for the symptoms. The treatment provided no relief. Her past medical history is significant for recurrent UTIs.     Problems:  Patient Active Problem List   Diagnosis Date Noted   Moderate persistent asthma without complication 01/01/2023   Anxiety 01/01/2023   Second degree perineal laceration 06/08/2022   Postpartum care following vaginal delivery 2/17 06/08/2022   Normal labor and delivery 06/07/2022   VBAC, delivered 05/24/2020   COVID-19 long hauler manifesting chronic dyspnea 03/01/2020    Allergies:  Allergies  Allergen Reactions   Oxycodone  Nausea And Vomiting   Latex Hives   Medications:  Current Outpatient Medications:    albuterol  (VENTOLIN   HFA) 108 (90 Base) MCG/ACT inhaler, Inhale 1-2 puffs into the lungs every 6 (six) hours as needed for wheezing or shortness of breath. (Patient taking differently: Inhale 2 puffs into the lungs as needed for wheezing or shortness of breath.), Disp: 8 g, Rfl: 1   Albuterol -Budesonide  (AIRSUPRA ) 90-80 MCG/ACT AERO, Inhale 2 puffs into the lungs every 6  (six) hours as needed., Disp: 10.7 g, Rfl: 2   BREZTRI  AEROSPHERE 160-9-4.8 MCG/ACT AERO, Inhale 2 puffs into the lungs 2 (two) times daily., Disp: 1 each, Rfl: 11   cephALEXin  (KEFLEX ) 500 MG capsule, Take 1 capsule (500 mg total) by mouth 2 (two) times daily., Disp: 14 capsule, Rfl: 0   cetirizine (ZYRTEC) 10 MG tablet, Take 10 mg by mouth daily., Disp: , Rfl:    fluticasone  (FLONASE ) 50 MCG/ACT nasal spray, Place 2 sprays into both nostrils daily., Disp: 16 g, Rfl: 0  Observations/Objective: Patient is well-developed, well-nourished in no acute distress.  Resting comfortably at home.  Head is normocephalic, atraumatic.  No labored breathing.  Speech is clear and coherent with logical content.  Patient is alert and oriented at baseline.    Assessment and Plan: 1. Suspected UTI (Primary) - cephALEXin  (KEFLEX ) 500 MG capsule; Take 1 capsule (500 mg total) by mouth 2 (two) times daily.  Dispense: 14 capsule; Refill: 0  - Worsening symptoms.  - Will treat empirically with Keflex  - May use AZO for bladder spasms - Continue to push fluids.  - Seek in person evaluation for urine culture if symptoms do not improve or if they worsen.    Follow Up Instructions: I discussed the assessment and treatment plan with the patient. The patient was provided an opportunity to ask questions and all were answered. The patient agreed with the plan and demonstrated an understanding of the instructions.  A copy of instructions were sent to the patient via MyChart unless otherwise noted below.    The patient was advised to call back or seek an in-person evaluation if the symptoms worsen or if the condition fails to improve as anticipated.    Angelia Kelp, PA-C

## 2023-10-02 NOTE — Patient Instructions (Signed)
 Leann Prom, thank you for joining Angelia Kelp, PA-C for today's virtual visit.  While this provider is not your primary care provider (PCP), if your PCP is located in our provider database this encounter information will be shared with them immediately following your visit.   A [facility] MyChart account gives you access to today's visit and all your visits, tests, and labs performed at [facility]  click here if you don't have a [facility] MyChart account or go to [email or web address]  Consent: (Patient) Vickie Maldonado provided verbal consent for this virtual visit at the beginning of the encounter.  Current Medications:  Current Outpatient Medications:    albuterol  (VENTOLIN  HFA) 108 (90 Base) MCG/ACT inhaler, Inhale 1-2 puffs into the lungs every 6 (six) hours as needed for wheezing or shortness of breath. (Patient taking differently: Inhale 2 puffs into the lungs as needed for wheezing or shortness of breath.), Disp: 8 g, Rfl: 1   Albuterol -Budesonide  (AIRSUPRA ) 90-80 MCG/ACT AERO, Inhale 2 puffs into the lungs every 6 (six) hours as needed., Disp: 10.7 g, Rfl: 2   BREZTRI  AEROSPHERE 160-9-4.8 MCG/ACT AERO, Inhale 2 puffs into the lungs 2 (two) times daily., Disp: 1 each, Rfl: 11   cephALEXin  (KEFLEX ) 500 MG capsule, Take 1 capsule (500 mg total) by mouth 2 (two) times daily., Disp: 14 capsule, Rfl: 0   cetirizine (ZYRTEC) 10 MG tablet, Take 10 mg by mouth daily., Disp: , Rfl:    fluticasone  (FLONASE ) 50 MCG/ACT nasal spray, Place 2 sprays into both nostrils daily., Disp: 16 g, Rfl: 0   Medications ordered in this encounter:  Meds ordered this encounter  Medications   cephALEXin  (KEFLEX ) 500 MG capsule    Sig: Take 1 capsule (500 mg total) by mouth 2 (two) times daily.    Dispense:  14 capsule    Refill:  0    Supervising Provider:   Corine Dice [[id number]]     *If you need refills on other medications prior to your next appointment, please  contact your pharmacy*  Follow-Up: Call back or seek an in-person evaluation if the symptoms worsen or if the condition fails to improve as anticipated.  [facility] [phone]  Other Instructions Urinary Tract Infection, Female A urinary tract infection (UTI) is an infection in your urinary tract. The urinary tract is made up of organs that make, store, and get rid of pee (urine) in your body. These organs include: The kidneys. The ureters. The bladder. The urethra. What are the causes? Most UTIs are caused by germs called bacteria. They may be in or near your genitals. These germs grow and cause swelling in your urinary tract. What increases the risk? You're more likely to get a UTI if: You're a female. The urethra is shorter in females than in males. You have a soft tube called a catheter that drains your pee. You can't control when you pee or poop. You have trouble peeing because of: A kidney stone. A urinary blockage. A nerve condition that affects your bladder. Not getting enough to drink. You're sexually active. You use a birth control inside your vagina, like spermicide. You're pregnant. You have low levels of the hormone estrogen in your body. You're an older adult. You're also more likely to get a UTI if you have other health problems. These may include: Diabetes. A weak immune system. Your immune system is your body's defense system. Sickle cell disease. Injury of the spine. What are the  signs or symptoms? Symptoms may include: Needing to pee right away. Peeing small amounts often. Pain or burning when you pee. Blood in your pee. Pee that smells bad or odd. Pain in your belly or lower back. You may also: Feel confused. This may be the first symptom in older adults. Vomit. Not feel hungry. Feel tired or easily annoyed. Have a fever or chills. How is this diagnosed? A UTI is diagnosed based on your medical history and an exam. You may also  have other tests. These may include: Pee tests. Blood tests. Tests for sexually transmitted infections (STIs). If you've had more than one UTI, you may need to have imaging studies done to find out why you keep getting them. How is this treated? A UTI can be treated by: Taking antibiotics or other medicines. Drinking enough fluid to keep your pee pale yellow. In rare cases, a UTI can cause a very bad condition called sepsis. Sepsis may be treated in the hospital. Follow these instructions at home: Medicines Take your medicines only as told by your health care provider. If you were given antibiotics, take them as told by your provider. Do not stop taking them even if you start to feel better. General instructions Make sure you: Pee often and fully. Do not hold your pee for a long time. Wipe from front to back after you pee or poop. Use each tissue only once when you wipe. Pee after you have sex. Do not douche or use sprays or powders in your genital area. Contact a health care provider if: Your symptoms don't get better after 1-2 days of taking antibiotics. Your symptoms go away and then come back. You have a fever or chills. You vomit or feel like you may vomit. Get help right away if: You have very bad pain in your back or lower belly. You faint. This information is not intended to replace advice given to you by your health care provider. Make sure you discuss any questions you have with your health care provider. Document Revised: 03/18/2023 Document Reviewed: 07/11/2022 Elsevier Patient Education  [employer].   If you have been instructed to have an in-person evaluation today at a local Urgent Care facility, please use the link below. It will take you to a list of all of our available [facility], including address, phone number and hours of operation. Please do not delay care.  [facility]  If you or a family member do not have a primary care  provider, use the link below to schedule a visit and establish care. When you choose a [facility] primary care physician or advanced practice provider, you gain a long-term partner in health. Find a Primary Care Provider  Learn more about [facility] in-office and virtual care options: [facility] - Get Care Now

## 2024-03-11 ENCOUNTER — Emergency Department (HOSPITAL_BASED_OUTPATIENT_CLINIC_OR_DEPARTMENT_OTHER)
Admission: EM | Admit: 2024-03-11 | Discharge: 2024-03-12 | Disposition: A | Discharge status: Home / Self Care | Attending: Emergency Medicine | Admitting: Emergency Medicine

## 2024-03-11 ENCOUNTER — Encounter (HOSPITAL_BASED_OUTPATIENT_CLINIC_OR_DEPARTMENT_OTHER): Payer: Self-pay

## 2024-03-11 ENCOUNTER — Emergency Department (HOSPITAL_BASED_OUTPATIENT_CLINIC_OR_DEPARTMENT_OTHER)

## 2024-03-11 ENCOUNTER — Other Ambulatory Visit: Payer: Self-pay

## 2024-03-11 DIAGNOSIS — D72829 Elevated white blood cell count, unspecified: Secondary | ICD-10-CM | POA: Diagnosis not present

## 2024-03-11 DIAGNOSIS — Z91040 Latex allergy status: Secondary | ICD-10-CM | POA: Insufficient documentation

## 2024-03-11 DIAGNOSIS — R1031 Right lower quadrant pain: Secondary | ICD-10-CM | POA: Diagnosis present

## 2024-03-11 LAB — URINALYSIS, ROUTINE W REFLEX MICROSCOPIC
Bacteria, UA: NONE SEEN
Bilirubin Urine: NEGATIVE
Glucose, UA: NEGATIVE mg/dL
Hgb urine dipstick: NEGATIVE
Ketones, ur: NEGATIVE mg/dL
Nitrite: NEGATIVE
Protein, ur: NEGATIVE mg/dL
Specific Gravity, Urine: 1.024 (ref 1.005–1.030)
pH: 5 (ref 5.0–8.0)

## 2024-03-11 LAB — CBC
HCT: 39.1 % (ref 36.0–46.0)
Hemoglobin: 12.8 g/dL (ref 12.0–15.0)
MCH: 25 pg — ABNORMAL LOW (ref 26.0–34.0)
MCHC: 32.7 g/dL (ref 30.0–36.0)
MCV: 76.2 fL — ABNORMAL LOW (ref 80.0–100.0)
Platelets: 340 K/uL (ref 150–400)
RBC: 5.13 MIL/uL — ABNORMAL HIGH (ref 3.87–5.11)
RDW: 15.3 % (ref 11.5–15.5)
WBC: 10.7 K/uL — ABNORMAL HIGH (ref 4.0–10.5)
nRBC: 0 % (ref 0.0–0.2)

## 2024-03-11 LAB — BASIC METABOLIC PANEL WITH GFR
Anion gap: 14 (ref 5–15)
BUN: 11 mg/dL (ref 6–20)
CO2: 20 mmol/L — ABNORMAL LOW (ref 22–32)
Calcium: 9.5 mg/dL (ref 8.9–10.3)
Chloride: 105 mmol/L (ref 98–111)
Creatinine, Ser: 0.8 mg/dL (ref 0.44–1.00)
GFR, Estimated: >60 mL/min (ref >60)
Glucose, Bld: 96 mg/dL (ref 70–99)
Potassium: 4.1 mmol/L (ref 3.5–5.1)
Sodium: 139 mmol/L (ref 135–145)

## 2024-03-11 LAB — PREGNANCY, URINE: Preg Test, Ur: NEGATIVE

## 2024-03-11 MED ORDER — ONDANSETRON 4 MG PO TBDP: 4.0000 mg | ORAL_TABLET | Freq: Four times a day (QID) | ORAL | 0 refills | Status: AC | PRN

## 2024-03-11 MED ORDER — ONDANSETRON HCL 4 MG/2ML IJ SOLN
4.0000 mg | Freq: Once | INTRAMUSCULAR | Status: AC
  Administered 2024-03-11: 4 mg via INTRAVENOUS
  Filled 2024-03-11: qty 2

## 2024-03-11 MED ORDER — IOHEXOL 300 MG/ML  SOLN
100.0000 mL | Freq: Once | INTRAMUSCULAR | Status: AC | PRN
  Administered 2024-03-11: 100 mL via INTRAVENOUS

## 2024-03-11 NOTE — ED Provider Notes (Signed)
 [place] EMERGENCY DEPARTMENT AT [facility] Provider Note   CSN: [id number] Arrival date & time: 03/11/24  2130     Patient presents with: Abdominal Pain   Vickie Maldonado is a 33 y.o. female with past medical history significant for previous C-section, otherwise overall noncontributory who presents concern for right sided abdominal pain radiating to right flank/back with nausea.  Loss of appetite.  Denies any history of kidney stones.  Reports that started with a stomachache but has since progressively worsened.  Reports that it is still not severe but seems to becoming more persistent.  Last menstrual period 1 month ago, reports that she is due for her menstrual cycle any day now.    Abdominal Pain      Prior to Admission medications   Medication Sig Start Date End Date Taking? Authorizing Provider  ondansetron  (ZOFRAN -ODT) 4 MG disintegrating tablet Take 1 tablet (4 mg total) by mouth every 6 (six) hours as needed for nausea or vomiting. 03/11/24  Yes Aideen Fenster H, PA-C  albuterol  (VENTOLIN  HFA) 108 (90 Base) MCG/ACT inhaler Inhale 1-2 puffs into the lungs every 6 (six) hours as needed for wheezing or shortness of breath. Patient taking differently: Inhale 2 puffs into the lungs as needed for wheezing or shortness of breath. 05/30/21   Neda Jennet LABOR, MD  Albuterol -Budesonide  (AIRSUPRA ) 90-80 MCG/ACT AERO Inhale 2 puffs into the lungs every 6 (six) hours as needed. 01/01/23   Allwardt, Mardy CHRISTELLA, PA-C  BREZTRI  AEROSPHERE 160-9-4.8 MCG/ACT AERO Inhale 2 puffs into the lungs 2 (two) times daily. 01/01/23   Allwardt, Mardy CHRISTELLA, PA-C  cephALEXin  (KEFLEX ) 500 MG capsule Take 1 capsule (500 mg total) by mouth 2 (two) times daily. 10/02/23   Vivienne Delon CHRISTELLA, PA-C  cetirizine (ZYRTEC) 10 MG tablet Take 10 mg by mouth daily.    [provider]  fluticasone  (FLONASE ) 50 MCG/ACT nasal spray Place 2 sprays into both nostrils daily. 06/30/23   Gladis Elsie BROCKS,  PA-C    Allergies: Oxycodone  and Latex    Review of Systems  Gastrointestinal:  Positive for abdominal pain.  All other systems reviewed and are negative.   Updated Vital Signs BP 122/82 (BP Location: Right Arm)   Pulse 75   Temp 98.7 F (37.1 C) (Oral)   Resp 18   Ht 5' 7 (1.702 m)   Wt 86.2 kg   LMP 02/11/2024   SpO2 99%   BMI 29.76 kg/m   Physical Exam Vitals and nursing note reviewed.  Constitutional:      General: She is not in acute distress.    Appearance: Normal appearance.  HENT:     Head: Normocephalic and atraumatic.  Eyes:     General:        Right eye: No discharge.        Left eye: No discharge.  Cardiovascular:     Rate and Rhythm: Normal rate and regular rhythm.     Heart sounds: No murmur heard.    No friction rub. No gallop.  Pulmonary:     Effort: Pulmonary effort is normal.     Breath sounds: Normal breath sounds.  Abdominal:     General: Bowel sounds are normal.     Palpations: Abdomen is soft.     Comments: Focal tenderness in the right lower quadrant, no rebound, rigidity, guarding.  No significant tenderness throughout the rest of the abdomen.  Skin:    General: Skin is warm and dry.  Capillary Refill: Capillary refill takes less than 2 seconds.  Neurological:     Mental Status: She is alert and oriented to person, place, and time.  Psychiatric:        Mood and Affect: Mood normal.        Behavior: Behavior normal.     (all labs ordered are listed, but only abnormal results are displayed) Labs Reviewed  URINALYSIS, ROUTINE W REFLEX MICROSCOPIC - Abnormal; Notable for the following components:      Result Value   Leukocytes,Ua SMALL (*)    All other components within normal limits  CBC - Abnormal; Notable for the following components:   WBC 10.7 (*)    RBC 5.13 (*)    MCV 76.2 (*)    MCH 25.0 (*)    All other components within normal limits  BASIC METABOLIC PANEL WITH GFR - Abnormal; Notable for the following components:    CO2 20 (*)    All other components within normal limits  PREGNANCY, URINE    EKG: None  Radiology: CT ABDOMEN PELVIS W CONTRAST Result Date: 03/11/2024 EXAM: CT ABDOMEN AND PELVIS WITH CONTRAST 03/11/2024 10:56:08 PM TECHNIQUE: CT of the abdomen and pelvis was performed with the administration of 100 mL of iohexol  (OMNIPAQUE ) 300 MG/ML solution. Multiplanar reformatted images are provided for review. Automated exposure control, iterative reconstruction, and/or weight-based adjustment of the mA/kV was utilized to reduce the radiation dose to as low as reasonably achievable. COMPARISON: None available. CLINICAL HISTORY: Abdominal pain, acute, nonlocalized; RLQ abdominal pain. FINDINGS: LOWER CHEST: No acute abnormality. LIVER: The liver is unremarkable. GALLBLADDER AND BILE DUCTS: Gallbladder is unremarkable. No biliary ductal dilatation. SPLEEN: No acute abnormality. PANCREAS: No acute abnormality. ADRENAL GLANDS: No acute abnormality. KIDNEYS, URETERS AND BLADDER: No stones in the kidneys or ureters. No hydronephrosis. No perinephric or periureteral stranding. Urinary bladder is unremarkable. GI AND BOWEL: Stomach demonstrates no acute abnormality. Normal appendix. There is no bowel obstruction. PERITONEUM AND RETROPERITONEUM: No ascites. No free air. VASCULATURE: Aorta is normal in caliber. LYMPH NODES: No lymphadenopathy. REPRODUCTIVE ORGANS: No acute abnormality. BONES AND SOFT TISSUES: No acute osseous abnormality. No focal soft tissue abnormality. IMPRESSION: 1. No acute findings in the abdomen or pelvis. Electronically signed by: Franky Crease MD 03/11/2024 11:01 PM EST RP Workstation: [id number]     Procedures   Medications Ordered in the ED  ondansetron  (ZOFRAN ) injection 4 mg (4 mg Intravenous Given 03/11/24 2232)  iohexol  (OMNIPAQUE ) 300 MG/ML solution 100 mL (100 mLs Intravenous Contrast Given 03/11/24 2256)                                    Medical Decision Making Amount  and/or Complexity of Data Reviewed Labs: ordered. Radiology: ordered.  Risk Prescription drug management.   This patient is a 33 y.o. female  who presents to the ED for concern of abdominal pain.   Differential diagnoses prior to evaluation: The emergent differential diagnosis includes, but is not limited to,  The causes of generalized abdominal pain include but are not limited to AAA, mesenteric ischemia, appendicitis, diverticulitis, DKA, gastritis, gastroenteritis, AMI, nephrolithiasis, pancreatitis, peritonitis, adrenal insufficiency,lead poisoning, iron  toxicity, intestinal ischemia, constipation, UTI,SBO/LBO, splenic rupture, biliary disease, IBD, IBS, PUD, or hepatitis, Ectopic pregnancy, ovarian torsion, PID . This is not an exhaustive differential.   Past Medical History / Co-morbidities / Social History: Previous c section, otherwise overall noncontributory  Physical Exam: Physical exam  performed. The pertinent findings include: Focal tenderness in the right lower quadrant, no rebound, rigidity, guarding.  No significant tenderness throughout the rest of the abdomen.   Vital signs stable in the emergency department, afebrile  Lab Tests/Imaging studies: I personally interpreted labs/imaging and the pertinent results include: CBC with mild leukocytosis, blood cells 10.7.  BMP overall unremarkable other than very mild bicarb deficit, CO2 20.  UA is unremarkable other than small leukocytes with no bacteria, no white blood cells, negative serum pregnancy test.  CT abdomen pelvis with contrast is unremarkable. I agree with the radiologist interpretation.  Medications: I ordered medication including Zofran  for nausea.  I have reviewed the patients home medicines and have made adjustments as needed.  On reevaluation patient pain not worsening, continues to have no rebound, rigidity, guarding or other worrisome intra-abdominal findings, given unremarkable CT evaluation, overall reassuring  lab work I feel that patient is stable for discharge at this time, extensive return precautions given.   Disposition: After consideration of the diagnostic results and the patients response to treatment, I feel that patient is stable for discharge with plan as above.   emergency department workup does not suggest an emergent condition requiring admission or immediate intervention beyond what has been performed at this time. The plan is: as above. The patient is safe for discharge and has been instructed to return immediately for worsening symptoms, change in symptoms or any other concerns.   Final diagnoses:  Right lower quadrant abdominal pain    ED Discharge Orders          Ordered    ondansetron  (ZOFRAN -ODT) 4 MG disintegrating tablet  Every 6 hours PRN        03/11/24 2311               Fred Hammes, [place] H, PA-C 03/11/24 2318    Pamella Ozell LABOR, DO 03/15/24 1145

## 2024-03-11 NOTE — Discharge Instructions (Signed)
 Please use Tylenol  or ibuprofen  for pain.  You may use 600 mg ibuprofen  every 6 hours or 1000 mg of Tylenol  every 6 hours.  You may choose to alternate between the 2.  This would be most effective.  Not to exceed 4 g of Tylenol  within 24 hours.  Not to exceed 3200 mg ibuprofen  24 hours.  Can use the nausea medication I prescribed up to every 6 hours as needed.  Please return if you have severe worsening abdominal pain.

## 2024-03-11 NOTE — ED Triage Notes (Signed)
 Pt reports R sided abd pain radiating to R flank/back with nausea. Pt denies any hx of kidney stones.
